# Patient Record
Sex: Male | Born: 1955 | Race: Black or African American | Hispanic: No | Smoking: Current every day smoker
Health system: Southern US, Community
[De-identification: ages and names within clinical notes are randomized; demographics above are authoritative.]

---

## 2020-08-10 ENCOUNTER — Encounter (HOSPITAL_COMMUNITY): Payer: Self-pay

## 2020-08-10 ENCOUNTER — Emergency Department (HOSPITAL_COMMUNITY)
Admission: EM | Admit: 2020-08-10 | Discharge: 2020-08-10 | Disposition: A | Discharge status: Home / Self Care | Payer: Self-pay | Attending: Emergency Medicine | Admitting: Emergency Medicine

## 2020-08-10 ENCOUNTER — Other Ambulatory Visit: Payer: Self-pay

## 2020-08-10 DIAGNOSIS — R600 Localized edema: Secondary | ICD-10-CM | POA: Insufficient documentation

## 2020-08-10 DIAGNOSIS — Z79899 Other long term (current) drug therapy: Secondary | ICD-10-CM | POA: Insufficient documentation

## 2020-08-10 DIAGNOSIS — F17210 Nicotine dependence, cigarettes, uncomplicated: Secondary | ICD-10-CM | POA: Insufficient documentation

## 2020-08-10 LAB — BRAIN NATRIURETIC PEPTIDE: B Natriuretic Peptide: 57.2 pg/mL (ref 0.0–100.0)

## 2020-08-10 LAB — COMPREHENSIVE METABOLIC PANEL
ALT: 19 U/L (ref 0–44)
AST: 19 U/L (ref 15–41)
Albumin: 3.6 g/dL (ref 3.5–5.0)
Alkaline Phosphatase: 69 U/L (ref 38–126)
Anion gap: 6 (ref 5–15)
BUN: 13 mg/dL (ref 8–23)
CO2: 28 mmol/L (ref 22–32)
Calcium: 9.3 mg/dL (ref 8.9–10.3)
Chloride: 104 mmol/L (ref 98–111)
Creatinine, Ser: 1.1 mg/dL (ref 0.61–1.24)
GFR, Estimated: >60 mL/min (ref >60)
Glucose, Bld: 101 mg/dL — ABNORMAL HIGH (ref 70–99)
Potassium: 3.5 mmol/L (ref 3.5–5.1)
Sodium: 138 mmol/L (ref 135–145)
Total Bilirubin: 0.4 mg/dL (ref 0.3–1.2)
Total Protein: 6.9 g/dL (ref 6.5–8.1)

## 2020-08-10 LAB — CBC WITH DIFFERENTIAL/PLATELET
Abs Immature Granulocytes: 0.03 10*3/uL (ref 0.00–0.07)
Basophils Absolute: 0 10*3/uL (ref 0.0–0.1)
Basophils Relative: 1 %
Eosinophils Absolute: 0.1 10*3/uL (ref 0.0–0.5)
Eosinophils Relative: 2 %
HCT: 36.7 % — ABNORMAL LOW (ref 39.0–52.0)
Hemoglobin: 12.3 g/dL — ABNORMAL LOW (ref 13.0–17.0)
Immature Granulocytes: 1 %
Lymphocytes Relative: 39 %
Lymphs Abs: 1.8 10*3/uL (ref 0.7–4.0)
MCH: 30.2 pg (ref 26.0–34.0)
MCHC: 33.5 g/dL (ref 30.0–36.0)
MCV: 90.2 fL (ref 80.0–100.0)
Monocytes Absolute: 0.5 10*3/uL (ref 0.1–1.0)
Monocytes Relative: 11 %
Neutro Abs: 2.2 10*3/uL (ref 1.7–7.7)
Neutrophils Relative %: 46 %
Platelets: 221 10*3/uL (ref 150–400)
RBC: 4.07 MIL/uL — ABNORMAL LOW (ref 4.22–5.81)
RDW: 13.9 % (ref 11.5–15.5)
WBC: 4.7 10*3/uL (ref 4.0–10.5)
nRBC: 0.4 % — ABNORMAL HIGH (ref 0.0–0.2)

## 2020-08-10 NOTE — ED Provider Notes (Signed)
MSE was initiated and I personally evaluated the patient and placed orders (if any) at  1:11 AM on August 10, 2020.  Patient to ED with increased LE edema and discomfort. History of same. No CP, SOB. On Lasix 20 mg daily but is becoming less effective. Out of Lasix now but took it daily until yesterday. No PCP currently.   3+ LE edema bilaterally   The patient appears stable so that the remainder of the MSE may be completed by another provider.   Elpidio Anis, PA-C 08/10/20 0114    Sabas Sous, MD 08/10/20 [phone]

## 2020-08-10 NOTE — Discharge Instructions (Addendum)
You were evaluated in the Emergency Department and after careful evaluation, we did not find any emergent condition requiring admission or further testing in the hospital.  Your exam/testing today was overall reassuring.  Swelling likely due to venous insufficiency.  Recommend compressions socks and close follow up with your PCP.  Please return to the Emergency Department if you experience any worsening of your condition.  Thank you for allowing [place] to be a part of your care.

## 2020-08-10 NOTE — ED Provider Notes (Signed)
[facility]-EMERGENCY DEPT [facility] Emergency Department Provider Note MRN:  [id number]  Arrival date & time: 08/10/20     Chief Complaint   Leg Swelling   History of Present Illness   Darin Roberts is a 65 y.o. year-old male with no pertinent PMH presenting to the ED with chief complaint of leg swelling.  Swelling of the legs for the past few months, worse over the past several days.  Has been taking 20 mg Lasix daily, does not seem to be helping.  Has been trying to elevate the legs and when he does it seems to improve.  Denies any chest pain or shortness of breath, no abdominal pain, no fever, no other complaints.  Review of Systems  A complete 10 system review of systems was obtained and all systems are negative except as noted in the HPI and PMH.   Patient's Health History   History reviewed. No pertinent past medical history.  History reviewed. No pertinent surgical history.  History reviewed. No pertinent family history.  Social History   Socioeconomic History   Marital status: Divorced    Spouse name: Not on file   Number of children: Not on file   Years of education: Not on file   Highest education level: Not on file  Occupational History   Not on file  Tobacco Use   Smoking status: Every Day    Types: Cigarettes   Smokeless tobacco: Never  Substance and Sexual Activity   Alcohol use: Yes   Drug use: Never   Sexual activity: Not on file  Other Topics Concern   Not on file  Social History Narrative   Not on file   Social Determinants of Health   Financial Resource Strain: Not on file  Food Insecurity: Not on file  Transportation Needs: Not on file  Physical Activity: Not on file  Stress: Not on file  Social Connections: Not on file  Intimate Partner Violence: Not on file     Physical Exam   Vitals:   08/10/20 0404 08/10/20 0423  BP: (!) 162/101 (!) 181/107  Pulse: 76 72  Resp: 18 18  Temp:  98.2 F (36.8 C)  SpO2: 95% 99%    CONSTITUTIONAL:  Well-appearing, NAD NEURO:  Alert and oriented x 3, no focal deficits EYES:  eyes equal and reactive ENT/NECK:  no LAD, no JVD CARDIO: Regular rate, well-perfused, normal S1 and S2 PULM:  CTAB no wheezing or rhonchi GI/GU:  normal bowel sounds, non-distended, non-tender MSK/SPINE:  No gross deformities SKIN: Chronic skin changes of the lower extremities with 1+ edema bilaterally PSYCH:  Appropriate speech and behavior  *Additional and/or pertinent findings included in MDM below  Diagnostic and Interventional Summary    EKG Interpretation  Date/Time:    Ventricular Rate:    PR Interval:    QRS Duration:   QT Interval:    QTC Calculation:   R Axis:     Text Interpretation:         Labs Reviewed  CBC WITH DIFFERENTIAL/PLATELET - Abnormal; Notable for the following components:      Result Value   RBC 4.07 (*)    Hemoglobin 12.3 (*)    HCT 36.7 (*)    nRBC 0.4 (*)    All other components within normal limits  COMPREHENSIVE METABOLIC PANEL - Abnormal; Notable for the following components:   Glucose, Bld 101 (*)    All other components within normal limits  BRAIN NATRIURETIC PEPTIDE    No orders  to display    Medications - No data to display   Procedures  /  Critical Care Procedures  ED Course and Medical Decision Making  I have reviewed the triage vital signs, the nursing notes, and pertinent available records from the EMR.  Listed above are laboratory and imaging tests that I personally ordered, reviewed, and interpreted and then considered in my medical decision making (see below for details).  Chronic leg swelling likely due to venous insufficiency, labs reassuring, no chest pain or shortness of breath, doubt cardiac or renal or hepatic etiology, appropriate for discharge with advice to use compression stockings and follow-up with PCP.       Elmer Sow. Pilar Plate, MD [facility] Emergency Medicine [facility] [email or web address]  Final Clinical  Impressions(s) / ED Diagnoses     ICD-10-CM   1. Leg edema  R60.0       ED Discharge Orders     None        Discharge Instructions Discussed with and Provided to Patient:    Discharge Instructions      You were evaluated in the Emergency Department and after careful evaluation, we did not find any emergent condition requiring admission or further testing in the hospital.  Your exam/testing today was overall reassuring.  Swelling likely due to venous insufficiency.  Recommend compressions socks and close follow up with your PCP.  Please return to the Emergency Department if you experience any worsening of your condition.  Thank you for allowing [place] to be a part of your care.        Sabas Sous, MD 08/10/20 [phone]

## 2020-08-10 NOTE — ED Triage Notes (Signed)
Bilateral leg swelling x 3 weeks and slight abdominal enlargement. Denies hx of CHF and CKD.

## 2020-08-11 ENCOUNTER — Other Ambulatory Visit: Payer: Self-pay

## 2020-08-11 ENCOUNTER — Emergency Department (HOSPITAL_COMMUNITY)
Admission: EM | Admit: 2020-08-11 | Discharge: 2020-08-11 | Disposition: A | Discharge status: Home / Self Care | Payer: Self-pay | Attending: Emergency Medicine | Admitting: Emergency Medicine

## 2020-08-11 DIAGNOSIS — Z5321 Procedure and treatment not carried out due to patient leaving prior to being seen by health care provider: Secondary | ICD-10-CM | POA: Insufficient documentation

## 2020-08-11 DIAGNOSIS — R600 Localized edema: Secondary | ICD-10-CM | POA: Insufficient documentation

## 2020-08-11 NOTE — ED Triage Notes (Signed)
Pt arrives to ER for c/o bilateral lower extremity leg edema from the knees down, pt c/o 5/10 pain.  Pt went to [facility] yesterday and d/c'd with lasix.  160/90, 70 HR, 98% ra.  Pt denies cp/shob.

## 2020-08-12 ENCOUNTER — Other Ambulatory Visit: Payer: Self-pay

## 2020-08-12 ENCOUNTER — Encounter (HOSPITAL_COMMUNITY): Payer: Self-pay

## 2020-08-12 ENCOUNTER — Emergency Department (HOSPITAL_COMMUNITY)
Admission: EM | Admit: 2020-08-12 | Discharge: 2020-08-12 | Disposition: A | Discharge status: Home / Self Care | Payer: Self-pay | Attending: Emergency Medicine | Admitting: Emergency Medicine

## 2020-08-12 DIAGNOSIS — R600 Localized edema: Secondary | ICD-10-CM | POA: Insufficient documentation

## 2020-08-12 DIAGNOSIS — F17210 Nicotine dependence, cigarettes, uncomplicated: Secondary | ICD-10-CM | POA: Insufficient documentation

## 2020-08-12 DIAGNOSIS — R202 Paresthesia of skin: Secondary | ICD-10-CM | POA: Insufficient documentation

## 2020-08-12 DIAGNOSIS — R609 Edema, unspecified: Secondary | ICD-10-CM

## 2020-08-12 DIAGNOSIS — Z91010 Allergy to peanuts: Secondary | ICD-10-CM | POA: Insufficient documentation

## 2020-08-12 DIAGNOSIS — I10 Essential (primary) hypertension: Secondary | ICD-10-CM | POA: Insufficient documentation

## 2020-08-12 NOTE — Discharge Instructions (Addendum)
Recommend using the compression hose as instructed to help reduce your leg swelling. Continue your blood pressure medications. It will be important for you to be seen and treated by your doctor as soon as you return home to [place].

## 2020-08-12 NOTE — ED Triage Notes (Signed)
Pt arrives with c/o bilateral lower leg swelling x 1 month. Sts HCTZ dose recently changed to 25mg .

## 2020-08-12 NOTE — ED Provider Notes (Addendum)
[facility]-EMERGENCY DEPT Provider Note   CSN: [id number] Arrival date & time: 08/12/20  0012     History Chief Complaint  Patient presents with   Leg Swelling    Darin Roberts is a 65 y.o. male.  Patient to ED for further evaluation of lower extremity swelling over the last one month. See for same on 7/20, discharged home with recommendation for PCP follow up. He returns now for persistent symptoms reporting that he was instructed to increase his HCTZ from 1/2 pill to a whole pill, which he did today for one dose. He reports subsequent tingling in his legs and a sense of dizziness. No pain. He cannot remember who instructed him to increase his HCTZ.   The history is provided by the patient. No language interpreter was used.      History reviewed. No pertinent past medical history.  There are no problems to display for this patient.   History reviewed. No pertinent surgical history.     No family history on file.  Social History   Tobacco Use   Smoking status: Every Day    Types: Cigarettes   Smokeless tobacco: Never  Substance Use Topics   Alcohol use: Yes   Drug use: Never    Home Medications Prior to Admission medications   Not on File    Allergies    Cimetidine, Peanut-containing drug products, Penicillins, Tetracyclines & related, Tomato, and Ranitidine  Review of Systems   Review of Systems  Constitutional:  Negative for chills and fever.  HENT: Negative.    Respiratory: Negative.    Cardiovascular:  Positive for leg swelling.  Gastrointestinal: Negative.   Musculoskeletal: Negative.   Skin: Negative.   Neurological:  Positive for dizziness.   Physical Exam Updated Vital Signs BP (!) 174/96 (BP Location: Left Arm)   Pulse 75   Temp 98.6 F (37 C) (Oral)   Resp 16   Ht 5' 10.5" (1.791 m)   Wt 117.9 kg   SpO2 95%   BMI 36.78 kg/m   Physical Exam Vitals and nursing note reviewed.  Constitutional:      Appearance: He  is well-developed.  Cardiovascular:     Rate and Rhythm: Normal rate.  Pulmonary:     Effort: Pulmonary effort is normal. No respiratory distress.  Musculoskeletal:        General: Normal range of motion.     Cervical back: Normal range of motion.     Right lower leg: Edema present.     Left lower leg: Edema present.  Skin:    General: Skin is warm and dry.  Neurological:     Mental Status: He is alert and oriented to person, place, and time.     Gait: Gait normal.    ED Results / Procedures / Treatments   Labs (all labs ordered are listed, but only abnormal results are displayed) Labs Reviewed - No data to display  EKG None  Radiology No results found.  Procedures Procedures   Medications Ordered in ED Medications - No data to display  ED Course  I have reviewed the triage vital signs and the nursing notes.  Pertinent labs & imaging results that were available during my care of the patient were reviewed by me and considered in my medical decision making (see chart for details).    MDM Rules/Calculators/A&P  Patient to ED for reevaluation of LE edema, and sxs of tingling and dizziness after increasing his HCTZ as he states he was instructed, although he cannot remember who instructed him to do this. He returned 7/21, and left without being seen.   The patient does not appear in any distress. He is ambulatory in the department without unsteadiness. VSS, hypertensive. No chest pain, SOB.   Initial interview/exam interrupted by the patient having to leave to go to the bathroom. He was then moved to a room where during exam process he became upset with his care, namely that his blood pressure was not being normalized, but again left the room to go to the bathroom prior to exam being completed. He had no evidence of difficulty or compromised breathing, no hypoxia, tachycardia or tachypnea. He walked on 2 occasions I observed and showed no signs of  dizziness, walking off balance or unsteadiness.   After review of the labs performed 7/20, the patient is felt appropriate for discharge home. He is encouraged to see his doctor for further management of hypertension. He is encouraged to use the compression stockings as recommended on previous visit - not wearing them at this time.   He reports he is unsatisfied with his care tonight and does not understand why his blood pressure is not being treated and that he is being discharged with it being elevated. Discussed at length that without evidence of heart strain (no chest pain, SOB) we do not treat blood pressure aggressively in the ED, especially at 174/96. Given his complaint about care, Dr. Nicanor Alcon saw the patient as well, without the patient's satisfaction, to which he voiced "I'll just check back in at 9:00 am.". He was told he could return for further care at any time.  Final Clinical Impression(s) / ED Diagnoses Final diagnoses:  None   Peripheral edema hypertension  Rx / DC Orders ED Discharge Orders     None        Elpidio Anis, PA-C 08/12/20 0345    Elpidio Anis, PA-C 08/12/20 0350    Palumbo, April, MD 08/12/20 8329

## 2020-08-24 ENCOUNTER — Other Ambulatory Visit: Payer: Self-pay

## 2020-08-24 ENCOUNTER — Emergency Department (HOSPITAL_COMMUNITY)
Admission: EM | Admit: 2020-08-24 | Discharge: 2020-08-24 | Disposition: A | Discharge status: Home / Self Care | Payer: Self-pay | Attending: Emergency Medicine | Admitting: Emergency Medicine

## 2020-08-24 ENCOUNTER — Emergency Department (HOSPITAL_COMMUNITY): Payer: Self-pay

## 2020-08-24 DIAGNOSIS — F17210 Nicotine dependence, cigarettes, uncomplicated: Secondary | ICD-10-CM | POA: Insufficient documentation

## 2020-08-24 DIAGNOSIS — R609 Edema, unspecified: Secondary | ICD-10-CM

## 2020-08-24 DIAGNOSIS — Z91010 Allergy to peanuts: Secondary | ICD-10-CM | POA: Insufficient documentation

## 2020-08-24 DIAGNOSIS — R600 Localized edema: Secondary | ICD-10-CM | POA: Insufficient documentation

## 2020-08-24 DIAGNOSIS — R0609 Other forms of dyspnea: Secondary | ICD-10-CM | POA: Insufficient documentation

## 2020-08-24 LAB — CBC WITH DIFFERENTIAL/PLATELET
Abs Immature Granulocytes: 0 10*3/uL (ref 0.00–0.07)
Basophils Absolute: 0 10*3/uL (ref 0.0–0.1)
Basophils Relative: 1 %
Eosinophils Absolute: 0.1 10*3/uL (ref 0.0–0.5)
Eosinophils Relative: 2 %
HCT: 37.2 % — ABNORMAL LOW (ref 39.0–52.0)
Hemoglobin: 12.3 g/dL — ABNORMAL LOW (ref 13.0–17.0)
Immature Granulocytes: 0 %
Lymphocytes Relative: 44 %
Lymphs Abs: 1.5 10*3/uL (ref 0.7–4.0)
MCH: 29.6 pg (ref 26.0–34.0)
MCHC: 33.1 g/dL (ref 30.0–36.0)
MCV: 89.6 fL (ref 80.0–100.0)
Monocytes Absolute: 0.4 10*3/uL (ref 0.1–1.0)
Monocytes Relative: 11 %
Neutro Abs: 1.5 10*3/uL — ABNORMAL LOW (ref 1.7–7.7)
Neutrophils Relative %: 42 %
Platelets: 218 10*3/uL (ref 150–400)
RBC: 4.15 MIL/uL — ABNORMAL LOW (ref 4.22–5.81)
RDW: 13.7 % (ref 11.5–15.5)
WBC: 3.5 10*3/uL — ABNORMAL LOW (ref 4.0–10.5)
nRBC: 0 % (ref 0.0–0.2)

## 2020-08-24 LAB — COMPREHENSIVE METABOLIC PANEL
ALT: 19 U/L (ref 0–44)
AST: 17 U/L (ref 15–41)
Albumin: 3.6 g/dL (ref 3.5–5.0)
Alkaline Phosphatase: 69 U/L (ref 38–126)
Anion gap: 7 (ref 5–15)
BUN: 15 mg/dL (ref 8–23)
CO2: 26 mmol/L (ref 22–32)
Calcium: 9 mg/dL (ref 8.9–10.3)
Chloride: 105 mmol/L (ref 98–111)
Creatinine, Ser: 1.13 mg/dL (ref 0.61–1.24)
GFR, Estimated: >60 mL/min (ref >60)
Glucose, Bld: 95 mg/dL (ref 70–99)
Potassium: 3.8 mmol/L (ref 3.5–5.1)
Sodium: 138 mmol/L (ref 135–145)
Total Bilirubin: 0.6 mg/dL (ref 0.3–1.2)
Total Protein: 6.7 g/dL (ref 6.5–8.1)

## 2020-08-24 LAB — URINALYSIS, ROUTINE W REFLEX MICROSCOPIC
Bilirubin Urine: NEGATIVE
Glucose, UA: NEGATIVE mg/dL
Hgb urine dipstick: NEGATIVE
Ketones, ur: NEGATIVE mg/dL
Leukocytes,Ua: NEGATIVE
Nitrite: NEGATIVE
Protein, ur: NEGATIVE mg/dL
Specific Gravity, Urine: 1.02 (ref 1.005–1.030)
pH: 5 (ref 5.0–8.0)

## 2020-08-24 LAB — TROPONIN I (HIGH SENSITIVITY): Troponin I (High Sensitivity): 13 ng/L (ref <18)

## 2020-08-24 LAB — BRAIN NATRIURETIC PEPTIDE: B Natriuretic Peptide: 61.5 pg/mL (ref 0.0–100.0)

## 2020-08-24 MED ORDER — FUROSEMIDE 20 MG PO TABS
40.0000 mg | ORAL_TABLET | Freq: Once | ORAL | Status: AC
  Administered 2020-08-24: 40 mg via ORAL
  Filled 2020-08-24: qty 2

## 2020-08-24 MED ORDER — FUROSEMIDE 40 MG PO TABS: 40.0000 mg | ORAL_TABLET | Freq: Every day | ORAL | 0 refills | Status: DC

## 2020-08-24 NOTE — ED Notes (Signed)
Pt ambulated with pulse ox, O2 remaining at 96-98%

## 2020-08-24 NOTE — ED Triage Notes (Signed)
Pt c/o bilateral leg swelling along with shortness of breath, worsening in exertion over past 3 weeks. Takes medication HCTZ without improvement. Today seeking medication attention d/t concern for tingling blather for past 1.5-2 weeks.pt states 4 lb increase in past 3 weeks, and has even increased shoe size d/t swelling.

## 2020-08-24 NOTE — Discharge Instructions (Addendum)
1. Medications: STOP HCTZ; START Lasix, usual home medications 2. Treatment: rest, monitor fluid and salt intake - decreasing both 3. Follow Up: Please followup with your primary doctor in 2-3 days for discussion of your diagnoses and further evaluation after today's visit; if you do not have a primary care doctor use the resource guide provided to find one; Please return to the ER for chest pain, shortness of breath or worsening symptoms

## 2020-08-24 NOTE — ED Provider Notes (Signed)
Emergency Medicine Provider Triage Evaluation Note  Darin Roberts , a 65 y.o. male  was evaluated in triage.  Pt complains of peripheral edema and SOB on exertion for the last 3.5 weeks.  Reports increased leg swelling and aching during that time.  Pt is not followed by a PCP or Cardiology.  Pt denies a hx of CHF.  Pt reports he was previously taking lasix but was switched to HCTZ at some point.  Pt reports compliance with this medication.   Pt home weight - 260 and reports 4 lb weight gain in the last few weeks and has needed to buy new shoes due to leg swelling  Review of Systems  Positive: Peripheral edema, shortness of breath Negative: Weakness, dysuria  Physical Exam  BP (!) 158/96 (BP Location: Right Arm)   Pulse 69   Temp 98.3 F (36.8 C)   Resp 20   SpO2 100%  Gen:   Awake, no distress   Resp:  Normal effort  MSK:   Moves extremities without difficulty, 3+ pitting edema Other:  anasarca  Medical Decision Making  Medically screening exam initiated at 1:47 AM.  Appropriate orders placed.  Darin Roberts was informed that the remainder of the evaluation will be completed by another provider, this initial triage assessment does not replace that evaluation, and the importance of remaining in the ED until their evaluation is complete.  Peripheral edema, SOB.    Darin Roberts, Darin Roberts 08/24/20 0151    Dione Booze, MD 08/24/20 Darin Roberts

## 2020-08-24 NOTE — ED Provider Notes (Signed)
[name] EMERGENCY DEPARTMENT Provider Note   CSN: [id number] Arrival date & time: 08/24/20  0133     History Chief Complaint  Patient presents with   Leg Swelling    Darin Roberts is a 65 y.o. male presents to the ED with complaints of peripheral edema and mild SOB on exertion for the last 3.5 weeks.  Reports increased leg swelling and aching during that time.  Pt is not followed by a PCP or Cardiology.  Pt denies a hx of CHF.  Pt reports he was previously taking lasix but was switched to HCTZ at some point.  Reports he is currently taking 25 mg of HCTZ every day.  Denies any missed doses.  Reports he currently weighs 260 pounds.  States 4 lb weight gain in the last few weeks and has needed to buy new shoes due to leg swelling.  Patient denies chest pain, shortness of breath at rest.  Denies fevers, chills, open wounds, weeping of his legs.  Denies abdominal pain, nausea or vomiting.  Reviewed.  Patient has been seen several times in the last several weeks for this.  Has no history of heart failure.  Has not established with PCP.    The history is provided by the patient and medical records. No language interpreter was used.      No past medical history on file.  There are no problems to display for this patient.   No past surgical history on file.     No family history on file.  Social History   Tobacco Use   Smoking status: Every Day    Types: Cigarettes   Smokeless tobacco: Never  Substance Use Topics   Alcohol use: Yes   Drug use: Never    Home Medications Prior to Admission medications   Medication Sig Start Date End Date Taking? Authorizing Provider  furosemide (LASIX) 40 MG tablet Take 1 tablet (40 mg total) by mouth daily. 08/24/20  Yes Herbie Lehrmann, Dahlia Client, PA-C    Allergies    Cimetidine, Peanut-containing drug products, Penicillins, Tetracyclines & related, Tomato, and Ranitidine  Review of Systems   Review of Systems  Constitutional:   Negative for appetite change, diaphoresis, fatigue, fever and unexpected weight change.  HENT:  Negative for mouth sores.   Eyes:  Negative for visual disturbance.  Respiratory:  Positive for shortness of breath. Negative for cough, chest tightness and wheezing.   Cardiovascular:  Positive for leg swelling. Negative for chest pain.  Gastrointestinal:  Negative for abdominal pain, constipation, diarrhea, nausea and vomiting.  Endocrine: Negative for polydipsia, polyphagia and polyuria.  Genitourinary:  Negative for dysuria, frequency, hematuria and urgency.  Musculoskeletal:  Negative for back pain and neck stiffness.  Skin:  Negative for rash.  Allergic/Immunologic: Negative for immunocompromised state.  Neurological:  Negative for syncope, light-headedness and headaches.  Hematological:  Does not bruise/bleed easily.  Psychiatric/Behavioral:  Negative for sleep disturbance. The patient is not nervous/anxious.    Physical Exam Updated Vital Signs BP (!) 158/96 (BP Location: Right Arm)   Pulse 69   Temp 98.3 F (36.8 C)   Resp 20   SpO2 100%   Physical Exam Vitals and nursing note reviewed.  Constitutional:      General: He is not in acute distress.    Appearance: He is not diaphoretic.  HENT:     Head: Normocephalic.  Eyes:     General: No scleral icterus.    Conjunctiva/sclera: Conjunctivae normal.  Cardiovascular:  Rate and Rhythm: Normal rate and regular rhythm.     Pulses: Normal pulses.          Radial pulses are 2+ on the right side and 2+ on the left side.  Pulmonary:     Effort: No tachypnea, accessory muscle usage, prolonged expiration, respiratory distress or retractions.     Breath sounds: No stridor.     Comments: Equal chest rise. No increased work of breathing. Abdominal:     General: There is no distension.     Palpations: Abdomen is soft.     Tenderness: There is no abdominal tenderness. There is no guarding or rebound.  Musculoskeletal:      Cervical back: Normal range of motion.     Right lower leg: Edema present.     Left lower leg: Edema present.     Comments: Moves all extremities equally and without difficulty. Profound bilateral lower extremity edema  Skin:    General: Skin is warm and dry.     Capillary Refill: Capillary refill takes less than 2 seconds.  Neurological:     Mental Status: He is alert.     GCS: GCS eye subscore is 4. GCS verbal subscore is 5. GCS motor subscore is 6.     Comments: Speech is clear and goal oriented.  Psychiatric:        Mood and Affect: Mood normal.    ED Results / Procedures / Treatments   Labs (all labs ordered are listed, but only abnormal results are displayed) Labs Reviewed  CBC WITH DIFFERENTIAL/PLATELET - Abnormal; Notable for the following components:      Result Value   WBC 3.5 (*)    RBC 4.15 (*)    Hemoglobin 12.3 (*)    HCT 37.2 (*)    Neutro Abs 1.5 (*)    All other components within normal limits  URINALYSIS, ROUTINE W REFLEX MICROSCOPIC - Abnormal; Notable for the following components:   APPearance HAZY (*)    All other components within normal limits  COMPREHENSIVE METABOLIC PANEL  BRAIN NATRIURETIC PEPTIDE  TROPONIN I (HIGH SENSITIVITY)  TROPONIN I (HIGH SENSITIVITY)    EKG EKG Interpretation  Date/Time:  Wednesday August 24 2020 01:54:36 EDT Ventricular Rate:  71 PR Interval:  154 QRS Duration: 74 QT Interval:  416 QTC Calculation: 452 R Axis:   69 Text Interpretation: Normal sinus rhythm Normal ECG No old tracing to compare Confirmed by Dione Booze ([id number]) on 08/24/2020 4:38:45 AM  Radiology DG Chest 2 View  Result Date: 08/24/2020 CLINICAL DATA:  Shortness of breath EXAM: CHEST - 2 VIEW COMPARISON:  None. FINDINGS: The heart size and mediastinal contours are within normal limits. Both lungs are clear. The visualized skeletal structures are unremarkable. IMPRESSION: No active cardiopulmonary disease. Electronically Signed   By: Deatra Robinson M.D.    On: 08/24/2020 02:30    Procedures Procedures   Medications Ordered in ED Medications  furosemide (LASIX) tablet 40 mg (40 mg Oral Given 08/24/20 0436)    ED Course  I have reviewed the triage vital signs and the nursing notes.  Pertinent labs & imaging results that were available during my care of the patient were reviewed by me and considered in my medical decision making (see chart for details).    MDM Rules/Calculators/A&P                           Patient presents with significant peripheral edema and hypertension.  He has been evaluated for this a number of times in the last several weeks.  Work-up each time has been reassuring.  Patient is currently taking HCTZ 25 mg/day. Hypertension today but no evidence of hypertensive urgency.  Blood pressure appears to be baseline for patient.  EKG without ischemia.  Urinalysis without proteinuria.  Chest x-ray without evidence of pulmonary edema or vascular congestion.  Mild anemia appears to be baseline.  On triage exam concern for anasarca however more comprehensive physical exam shows abdomen is soft and nontender.  No palpable or visible edema of the abdomen.  Patient ambulates here in the emergency department without respiratory distress, increased work of breathing or hypoxia.  No evidence of congestive heart failure today.  Patient reports he felt the Lasix was working better than the HCTZ.  We will switch him back.  Strongly encourage patient to establish with a primary care and discussed that management of peripheral edema requires regular check-in's and medication adjustments which are difficult to do in the emergency department setting.  Patient states understanding and is in agreement with the plan.    Final Clinical Impression(s) / ED Diagnoses Final diagnoses:  Peripheral edema    Rx / DC Orders ED Discharge Orders          Ordered    furosemide (LASIX) 40 MG tablet  Daily        08/24/20 0436              Marquavius Scaife, Dahlia Client, PA-C 08/24/20 0458    Dione Booze, MD 08/24/20 [phone]

## 2020-08-27 ENCOUNTER — Encounter (HOSPITAL_COMMUNITY): Payer: Self-pay | Admitting: Emergency Medicine

## 2020-08-27 ENCOUNTER — Emergency Department (HOSPITAL_COMMUNITY)
Admission: EM | Admit: 2020-08-27 | Discharge: 2020-08-28 | Disposition: A | Discharge status: Home / Self Care | Payer: Self-pay | Attending: Emergency Medicine | Admitting: Emergency Medicine

## 2020-08-27 ENCOUNTER — Other Ambulatory Visit: Payer: Self-pay

## 2020-08-27 DIAGNOSIS — Z5321 Procedure and treatment not carried out due to patient leaving prior to being seen by health care provider: Secondary | ICD-10-CM | POA: Insufficient documentation

## 2020-08-27 DIAGNOSIS — R600 Localized edema: Secondary | ICD-10-CM | POA: Insufficient documentation

## 2020-08-27 DIAGNOSIS — M79604 Pain in right leg: Secondary | ICD-10-CM | POA: Insufficient documentation

## 2020-08-27 NOTE — ED Triage Notes (Signed)
Per EMS, pt c/o left leg pain and BLLE edema. Recently rx'd lasix. Pt ambulatory in triage without difficulty. VSS.

## 2020-08-28 NOTE — ED Provider Notes (Addendum)
Patient left without being seen; was not brought back to room.  Note: Portions of this report may have been transcribed using voice recognition software. Every effort was made to ensure accuracy; however, inadvertent computerized transcription errors may still be present.    Bill Salinas, PA-C 08/28/20 0700    Bill Salinas, PA-C 09/16/20 5797    Pricilla Loveless, MD 09/16/20 [phone]

## 2020-08-29 ENCOUNTER — Encounter (HOSPITAL_COMMUNITY): Payer: Self-pay | Admitting: Emergency Medicine

## 2020-08-29 ENCOUNTER — Emergency Department (HOSPITAL_COMMUNITY)
Admission: EM | Admit: 2020-08-29 | Discharge: 2020-08-29 | Disposition: A | Discharge status: Home / Self Care | Payer: Self-pay | Attending: Emergency Medicine | Admitting: Emergency Medicine

## 2020-08-29 ENCOUNTER — Other Ambulatory Visit: Payer: Self-pay

## 2020-08-29 DIAGNOSIS — F17210 Nicotine dependence, cigarettes, uncomplicated: Secondary | ICD-10-CM | POA: Insufficient documentation

## 2020-08-29 DIAGNOSIS — M7989 Other specified soft tissue disorders: Secondary | ICD-10-CM | POA: Insufficient documentation

## 2020-08-29 DIAGNOSIS — R202 Paresthesia of skin: Secondary | ICD-10-CM | POA: Insufficient documentation

## 2020-08-29 DIAGNOSIS — R519 Headache, unspecified: Secondary | ICD-10-CM | POA: Insufficient documentation

## 2020-08-29 DIAGNOSIS — R059 Cough, unspecified: Secondary | ICD-10-CM | POA: Insufficient documentation

## 2020-08-29 DIAGNOSIS — I872 Venous insufficiency (chronic) (peripheral): Secondary | ICD-10-CM | POA: Insufficient documentation

## 2020-08-29 DIAGNOSIS — Z5321 Procedure and treatment not carried out due to patient leaving prior to being seen by health care provider: Secondary | ICD-10-CM | POA: Insufficient documentation

## 2020-08-29 NOTE — ED Triage Notes (Addendum)
Patient reports runny nose/nasal congestion and occasional dry cough with headache this week . Respirations unlabored. No fever or chills . Hypertensive at triage .

## 2020-08-30 ENCOUNTER — Emergency Department (HOSPITAL_COMMUNITY)
Admission: EM | Admit: 2020-08-30 | Discharge: 2020-08-30 | Disposition: A | Discharge status: Home / Self Care | Payer: Self-pay | Attending: Emergency Medicine | Admitting: Emergency Medicine

## 2020-08-30 ENCOUNTER — Encounter (HOSPITAL_COMMUNITY): Payer: Self-pay

## 2020-08-30 DIAGNOSIS — M7989 Other specified soft tissue disorders: Secondary | ICD-10-CM

## 2020-08-30 DIAGNOSIS — I872 Venous insufficiency (chronic) (peripheral): Secondary | ICD-10-CM

## 2020-08-30 NOTE — ED Notes (Signed)
Pt ambulatory in ED lobby. 

## 2020-08-30 NOTE — Discharge Instructions (Addendum)
You were evaluated in the Emergency Department and after careful evaluation, we did not find any emergent condition requiring admission or further testing in the hospital.  Your exam/testing today was overall reassuring.  Symptoms seem to be due to venous insufficiency.  Recommend compression socks or stockings and elevating the legs as we discussed.  Continue the HCTZ, recommend follow-up with your primary care doctor.  Please return to the Emergency Department if you experience any worsening of your condition.  Thank you for allowing [place] to be a part of your care.

## 2020-08-30 NOTE — ED Triage Notes (Signed)
Pt complains of left leg swelling and tingling x 4 weeks. Pt also complains of a headache x 1 week.

## 2020-08-30 NOTE — ED Provider Notes (Signed)
[facility]-EMERGENCY DEPT [facility] Emergency Department Provider Note MRN:  [id number]  Arrival date & time: 08/30/20     Chief Complaint   Leg Swelling, Headache, and Tingling   History of Present Illness   Darin Roberts is a 65 y.o. year-old male with a history of leg swelling presenting to the ED with chief complaint of leg swelling.  Leg swelling for the past several weeks, not getting better.  Has been taking HCTZ.  Denies any trauma, no chest pain, no shortness of breath, no fever, no cough, no abdominal pain.  Symptoms are constant, mild to moderate, no exacerbating or alleviating factors.  Review of Systems  A complete 10 system review of systems was obtained and all systems are negative except as noted in the HPI and PMH.   Patient's Health History   History reviewed. No pertinent past medical history.  History reviewed. No pertinent surgical history.  No family history on file.  Social History   Socioeconomic History   Marital status: Divorced    Spouse name: Not on file   Number of children: Not on file   Years of education: Not on file   Highest education level: Not on file  Occupational History   Not on file  Tobacco Use   Smoking status: Every Day    Types: Cigarettes   Smokeless tobacco: Never  Substance and Sexual Activity   Alcohol use: Yes   Drug use: Never   Sexual activity: Not on file  Other Topics Concern   Not on file  Social History Narrative   Not on file   Social Determinants of Health   Financial Resource Strain: Not on file  Food Insecurity: Not on file  Transportation Needs: Not on file  Physical Activity: Not on file  Stress: Not on file  Social Connections: Not on file  Intimate Partner Violence: Not on file     Physical Exam   Vitals:   08/30/20 0044 08/30/20 0410  BP: (!) 185/109 (!) 151/96  Pulse: 66 79  Resp: 16 16  Temp: 98 F (36.7 C)   SpO2: 95% 96%    CONSTITUTIONAL: Well-appearing, NAD NEURO:  Alert and  oriented x 3, no focal deficits EYES:  eyes equal and reactive ENT/NECK:  no LAD, no JVD CARDIO: Regular rate, well-perfused, normal S1 and S2 PULM:  CTAB no wheezing or rhonchi GI/GU:  normal bowel sounds, non-distended, non-tender MSK/SPINE:  No gross deformities, bilateral lower extremity edema and chronic skin changes SKIN:  no rash, atraumatic PSYCH:  Appropriate speech and behavior  *Additional and/or pertinent findings included in MDM below  Diagnostic and Interventional Summary    EKG Interpretation  Date/Time:    Ventricular Rate:    PR Interval:    QRS Duration:   QT Interval:    QTC Calculation:   R Axis:     Text Interpretation:         Labs Reviewed - No data to display  No orders to display    Medications - No data to display   Procedures  /  Critical Care Procedures  ED Course and Medical Decision Making  I have reviewed the triage vital signs, the nursing notes, and pertinent available records from the EMR.  Listed above are laboratory and imaging tests that I personally ordered, reviewed, and interpreted and then considered in my medical decision making (see below for details).  Repeated ED visits for the same problem, problem does not seem to be worsening, just not  getting better.  Patient was further educated on the cause of his leg swelling.  He has had multiple work-ups that do not show any evidence of CHF or liver impairment or hepatic impairment.  His swelling is almost certainly due to venous insufficiency, advised leg stockings or compressive socks which she is not using, advised elevating the legs, which she is not doing.  Appropriate for discharge.       Elmer Sow. Pilar Plate, MD [facility] Emergency Medicine [facility] [email or web address]  Final Clinical Impressions(s) / ED Diagnoses     ICD-10-CM   1. Leg swelling  M79.89     2. Venous insufficiency (chronic) (peripheral)  I87.2       ED Discharge Orders     None         Discharge Instructions Discussed with and Provided to Patient:    Discharge Instructions      You were evaluated in the Emergency Department and after careful evaluation, we did not find any emergent condition requiring admission or further testing in the hospital.  Your exam/testing today was overall reassuring.  Symptoms seem to be due to venous insufficiency.  Recommend compression socks or stockings and elevating the legs as we discussed.  Continue the HCTZ, recommend follow-up with your primary care doctor.  Please return to the Emergency Department if you experience any worsening of your condition.  Thank you for allowing [place] to be a part of your care.        Sabas Sous, MD 08/30/20 [phone]

## 2020-08-31 ENCOUNTER — Other Ambulatory Visit: Payer: Self-pay

## 2020-08-31 ENCOUNTER — Encounter (HOSPITAL_COMMUNITY): Payer: Self-pay

## 2020-08-31 ENCOUNTER — Emergency Department (HOSPITAL_COMMUNITY)
Admission: EM | Admit: 2020-08-31 | Discharge: 2020-08-31 | Disposition: A | Discharge status: Home / Self Care | Payer: PRIVATE HEALTH INSURANCE | Attending: Emergency Medicine | Admitting: Emergency Medicine

## 2020-08-31 DIAGNOSIS — I878 Other specified disorders of veins: Secondary | ICD-10-CM

## 2020-08-31 DIAGNOSIS — I872 Venous insufficiency (chronic) (peripheral): Secondary | ICD-10-CM | POA: Insufficient documentation

## 2020-08-31 DIAGNOSIS — F17210 Nicotine dependence, cigarettes, uncomplicated: Secondary | ICD-10-CM | POA: Insufficient documentation

## 2020-08-31 DIAGNOSIS — I10 Essential (primary) hypertension: Secondary | ICD-10-CM | POA: Insufficient documentation

## 2020-08-31 DIAGNOSIS — Z91010 Allergy to peanuts: Secondary | ICD-10-CM | POA: Insufficient documentation

## 2020-08-31 MED ORDER — AMLODIPINE BESYLATE 5 MG PO TABS
5.0000 mg | ORAL_TABLET | Freq: Once | ORAL | Status: AC
  Administered 2020-08-31: 5 mg via ORAL
  Filled 2020-08-31: qty 1

## 2020-08-31 MED ORDER — AMLODIPINE BESYLATE 5 MG PO TABS: 5.0000 mg | ORAL_TABLET | Freq: Every day | ORAL | 0 refills | Status: AC

## 2020-08-31 MED ORDER — FUROSEMIDE 20 MG PO TABS: 40.0000 mg | ORAL_TABLET | Freq: Every day | ORAL | 0 refills | Status: AC

## 2020-08-31 NOTE — Discharge Instructions (Addendum)
Stop taking hydrochlorothiazide.  Please check your blood pressure once a day and keep a record of it.  Take that record with you when you see your primary care provider.  Your blood pressure should be managed by your primary care provider.  While you are welcome to come to the emergency department at any time to evaluate your concerns, it is not the proper location to adjust your blood pressure medications.

## 2020-08-31 NOTE — ED Notes (Signed)
Pt reports his BP goes up after taking his HCTZ. Does not want blood work done at this time.

## 2020-08-31 NOTE — ED Triage Notes (Signed)
Pt states that he may need to have a medication (HCTZ) change due to it not working effectively. Pt complains of wheezing and leg swelling.

## 2020-08-31 NOTE — ED Notes (Signed)
Pt ambulatory in ED lobby. 

## 2020-08-31 NOTE — ED Provider Notes (Signed)
[facility]-EMERGENCY DEPT Provider Note   CSN: [id number] Arrival date & time: 08/31/20  0110     History Chief Complaint  Patient presents with   Hypertension   Leg Swelling   Wheezing    Darin Roberts is a 65 y.o. male.  The history is provided by the patient.  Hypertension  Wheezing He has history of hypertension and chronic venous stasis and comes in because of concerns of elevated blood pressure and also intolerance to taking hydrochlorothiazide.  He states that every time he takes it he gets some headache and dizziness and sometimes some paresthesias.  These are relatively short-lived, but have been present each time that he takes it.  There was a period where he was switched to furosemide and he did not have similar symptoms.  He denies chest pain or dyspnea.   History reviewed. No pertinent past medical history.  There are no problems to display for this patient.   History reviewed. No pertinent surgical history.     History reviewed. No pertinent family history.  Social History   Tobacco Use   Smoking status: Every Day    Types: Cigarettes   Smokeless tobacco: Never  Substance Use Topics   Alcohol use: Yes   Drug use: Never    Home Medications Prior to Admission medications   Medication Sig Start Date End Date Taking? Authorizing Provider  furosemide (LASIX) 40 MG tablet Take 1 tablet (40 mg total) by mouth daily. 08/24/20   Muthersbaugh, Dahlia Client, PA-C    Allergies    Cimetidine, Peanut-containing drug products, Penicillins, Tetracyclines & related, Tomato, and Ranitidine  Review of Systems   Review of Systems  Respiratory:  Positive for wheezing.   All other systems reviewed and are negative.  Physical Exam Updated Vital Signs BP (!) 166/133   Pulse 90   Temp 97.8 F (36.6 C) (Oral)   Resp 18   SpO2 97%   Physical Exam Vitals and nursing note reviewed.  65 year old male, resting comfortably and in no acute distress.  Vital signs are significant for elevated blood pressure. Oxygen saturation is 97%, which is normal. Head is normocephalic and atraumatic. PERRLA, EOMI. Oropharynx is clear. Neck is nontender and supple without adenopathy or JVD. Back is nontender and there is no CVA tenderness. Lungs are clear without rales, wheezes, or rhonchi. Chest is nontender. Heart has regular rate and rhythm without murmur. Abdomen is soft, flat, nontender without masses or hepatosplenomegaly and peristalsis is normoactive. Extremities have 2+ nonpitting edema, full range of motion is present. Skin is warm and dry without rash. Neurologic: Mental status is normal, cranial nerves are intact, there are no motor or sensory deficits.  ED Results / Procedures / Treatments    Procedures Procedures   Medications Ordered in ED Medications  amLODipine (NORVASC) tablet 5 mg (has no administration in time range)    ED Course  I have reviewed the triage vital signs and the nursing notes.  MDM Rules/Calculators/A&P                         Elevated blood pressure.  Chronic leg swelling.  Apparent intolerance to hydrochlorothiazide.  Old records reviewed, and this is his eighth ED visit over the past 3 weeks for the same complaints.  Blood pressure has been elevated on each occasion.  Diuretics have been changed without any significant change in his clinical picture.  At this point, he does appear to be  intolerant of hydrochlorothiazide and I have asked him to stop taking it, will replace with a low-dose of furosemide.  We will add amlodipine for blood pressure control.  He has an appointment with his PCP in 5 days and he is to keep that appointment.  It was stressed to the patient that blood pressure management should be done through his PCP and not through the emergency department, but he was reassured that he is welcome to come to the emergency department at any time for concerns of any acute process.  Patient expressed  understanding.  Final Clinical Impression(s) / ED Diagnoses Final diagnoses:  Poorly-controlled hypertension  Chronic venous stasis    Rx / DC Orders ED Discharge Orders          Ordered    furosemide (LASIX) 20 MG tablet  Daily        08/31/20 0350    amLODipine (NORVASC) 5 MG tablet  Daily        08/31/20 0350             Dione Booze, MD 08/31/20 [phone]

## 2020-09-03 ENCOUNTER — Other Ambulatory Visit: Payer: Self-pay

## 2020-09-03 ENCOUNTER — Emergency Department (HOSPITAL_COMMUNITY)
Admission: EM | Admit: 2020-09-03 | Discharge: 2020-09-03 | Disposition: A | Discharge status: Home / Self Care | Payer: Self-pay | Attending: Emergency Medicine | Admitting: Emergency Medicine

## 2020-09-03 ENCOUNTER — Emergency Department (HOSPITAL_COMMUNITY): Payer: Self-pay

## 2020-09-03 DIAGNOSIS — Z91010 Allergy to peanuts: Secondary | ICD-10-CM | POA: Insufficient documentation

## 2020-09-03 DIAGNOSIS — F17210 Nicotine dependence, cigarettes, uncomplicated: Secondary | ICD-10-CM | POA: Insufficient documentation

## 2020-09-03 DIAGNOSIS — R600 Localized edema: Secondary | ICD-10-CM | POA: Insufficient documentation

## 2020-09-03 DIAGNOSIS — R0602 Shortness of breath: Secondary | ICD-10-CM | POA: Insufficient documentation

## 2020-09-03 LAB — BASIC METABOLIC PANEL
Anion gap: 5 (ref 5–15)
BUN: 17 mg/dL (ref 8–23)
CO2: 29 mmol/L (ref 22–32)
Calcium: 9.2 mg/dL (ref 8.9–10.3)
Chloride: 102 mmol/L (ref 98–111)
Creatinine, Ser: 1.4 mg/dL — ABNORMAL HIGH (ref 0.61–1.24)
GFR, Estimated: 57 mL/min — ABNORMAL LOW (ref >60)
Glucose, Bld: 109 mg/dL — ABNORMAL HIGH (ref 70–99)
Potassium: 4.3 mmol/L (ref 3.5–5.1)
Sodium: 136 mmol/L (ref 135–145)

## 2020-09-03 LAB — CBC WITH DIFFERENTIAL/PLATELET
Abs Immature Granulocytes: 0 10*3/uL (ref 0.00–0.07)
Basophils Absolute: 0 10*3/uL (ref 0.0–0.1)
Basophils Relative: 1 %
Eosinophils Absolute: 0.1 10*3/uL (ref 0.0–0.5)
Eosinophils Relative: 2 %
HCT: 36.7 % — ABNORMAL LOW (ref 39.0–52.0)
Hemoglobin: 12.5 g/dL — ABNORMAL LOW (ref 13.0–17.0)
Immature Granulocytes: 0 %
Lymphocytes Relative: 41 %
Lymphs Abs: 1.9 10*3/uL (ref 0.7–4.0)
MCH: 30.6 pg (ref 26.0–34.0)
MCHC: 34.1 g/dL (ref 30.0–36.0)
MCV: 89.7 fL (ref 80.0–100.0)
Monocytes Absolute: 0.6 10*3/uL (ref 0.1–1.0)
Monocytes Relative: 12 %
Neutro Abs: 2.1 10*3/uL (ref 1.7–7.7)
Neutrophils Relative %: 44 %
Platelets: 203 10*3/uL (ref 150–400)
RBC: 4.09 MIL/uL — ABNORMAL LOW (ref 4.22–5.81)
RDW: 13.7 % (ref 11.5–15.5)
WBC: 4.7 10*3/uL (ref 4.0–10.5)
nRBC: 0 % (ref 0.0–0.2)

## 2020-09-03 LAB — BRAIN NATRIURETIC PEPTIDE: B Natriuretic Peptide: 27.5 pg/mL (ref 0.0–100.0)

## 2020-09-03 NOTE — ED Provider Notes (Signed)
[facility] EMERGENCY DEPARTMENT Provider Note   CSN: [id number] Arrival date & time: 09/03/20  0123     History Chief Complaint  Patient presents with   Shortness of Breath    Darin Roberts is a 65 y.o. male.   Shortness of Breath Severity:  Mild Onset quality:  Gradual Duration:  2 weeks Timing:  Intermittent Chronicity:  Chronic Context: not activity       No past medical history on file.  There are no problems to display for this patient.   No past surgical history on file.     No family history on file.  Social History   Tobacco Use   Smoking status: Every Day    Types: Cigarettes   Smokeless tobacco: Never  Substance Use Topics   Alcohol use: Yes   Drug use: Never    Home Medications Prior to Admission medications   Medication Sig Start Date End Date Taking? Authorizing Provider  amLODipine (NORVASC) 5 MG tablet Take 1 tablet (5 mg total) by mouth daily. 08/31/20   Dione Booze, MD  furosemide (LASIX) 20 MG tablet Take 2 tablets (40 mg total) by mouth daily. 08/31/20   Dione Booze, MD    Allergies    Cimetidine, Peanut-containing drug products, Penicillins, Tetracyclines & related, Tomato, and Ranitidine  Review of Systems   Review of Systems  Respiratory:  Positive for shortness of breath.   All other systems reviewed and are negative.  Physical Exam Updated Vital Signs BP (!) 148/90   Pulse 86   Temp 97.9 F (36.6 C) (Oral)   Resp 16   SpO2 (!) 89%   Physical Exam Vitals and nursing note reviewed.  Constitutional:      Appearance: He is well-developed.  HENT:     Head: Normocephalic and atraumatic.  Cardiovascular:     Rate and Rhythm: Normal rate.  Pulmonary:     Effort: Pulmonary effort is normal. No respiratory distress.     Breath sounds: No decreased breath sounds or wheezing.  Abdominal:     General: There is no distension.  Musculoskeletal:        General: Normal range of motion.     Cervical back:  Normal range of motion.     Right lower leg: Edema present.     Left lower leg: Edema present.  Neurological:     General: No focal deficit present.     Mental Status: He is alert.    ED Results / Procedures / Treatments   Labs (all labs ordered are listed, but only abnormal results are displayed) Labs Reviewed  CBC WITH DIFFERENTIAL/PLATELET - Abnormal; Notable for the following components:      Result Value   RBC 4.09 (*)    Hemoglobin 12.5 (*)    HCT 36.7 (*)    All other components within normal limits  BASIC METABOLIC PANEL - Abnormal; Notable for the following components:   Glucose, Bld 109 (*)    Creatinine, Ser 1.40 (*)    GFR, Estimated 57 (*)    All other components within normal limits  BRAIN NATRIURETIC PEPTIDE    EKG EKG Interpretation  Date/Time:  Saturday September 03 2020 01:49:34 EDT Ventricular Rate:  70 PR Interval:  162 QRS Duration: 76 QT Interval:  408 QTC Calculation: 440 R Axis:   54 Text Interpretation: Normal sinus rhythm Normal ECG Confirmed by Marily Memos [phone]) on 09/03/2020 2:42:29 AM  Radiology DG Chest 2 View  Result Date: 09/03/2020  CLINICAL DATA:  Shortness of breath EXAM: CHEST - 2 VIEW COMPARISON:  08/24/2020 FINDINGS: Lungs are clear.  No pleural effusion or pneumothorax. The heart is normal in size. Visualized osseous structures are within normal limits. IMPRESSION: Normal chest radiographs. Electronically Signed   By: Charline Bills M.D.   On: 09/03/2020 02:32    Procedures Procedures   Medications Ordered in ED Medications - No data to display  ED Course  I have reviewed the triage vital signs and the nursing notes.  Pertinent labs & imaging results that were available during my care of the patient were reviewed by me and considered in my medical decision making (see chart for details).    MDM Rules/Calculators/A&P                           No obvious changes from may recent visits. Will continue with plan to fu w/  pcp on Monday.   Final Clinical Impression(s) / ED Diagnoses Final diagnoses:  None    Rx / DC Orders ED Discharge Orders     None        Quinnie Barcelo, Barbara Cower, MD 09/03/20 [phone]

## 2020-09-03 NOTE — ED Provider Notes (Signed)
MSE was initiated and I personally evaluated the patient and placed orders (if any) at  1:34 AM on September 03, 2020.  Darin Roberts returns to the ED after waking up tonight feeling that he was choking, couldn't catch his breath. No chest pain. No fever. He reports h/o sleep apnea but not on CPAP. Legs continue to be swollen.   Bilateral LE edema Awake, alert, in NAD  The patient appears stable so that the remainder of the MSE may be completed by another provider.   Elpidio Anis, PA-C 09/03/20 9675    Darin Memos, MD 09/03/20 [phone]

## 2020-09-03 NOTE — ED Triage Notes (Signed)
Pt reported to ED with c/o [facility], sleep apnea and dizziness. Denies usage of CPAP. Reports occasional cough.

## 2020-09-06 ENCOUNTER — Emergency Department (HOSPITAL_COMMUNITY)
Admission: EM | Admit: 2020-09-06 | Discharge: 2020-09-06 | Disposition: A | Discharge status: Home / Self Care | Payer: Self-pay | Attending: Emergency Medicine | Admitting: Emergency Medicine

## 2020-09-06 ENCOUNTER — Other Ambulatory Visit: Payer: Self-pay

## 2020-09-06 ENCOUNTER — Emergency Department (HOSPITAL_COMMUNITY): Payer: Self-pay

## 2020-09-06 ENCOUNTER — Encounter (HOSPITAL_COMMUNITY): Payer: Self-pay

## 2020-09-06 DIAGNOSIS — I10 Essential (primary) hypertension: Secondary | ICD-10-CM | POA: Insufficient documentation

## 2020-09-06 DIAGNOSIS — R0602 Shortness of breath: Secondary | ICD-10-CM | POA: Insufficient documentation

## 2020-09-06 DIAGNOSIS — R079 Chest pain, unspecified: Secondary | ICD-10-CM | POA: Insufficient documentation

## 2020-09-06 DIAGNOSIS — Z79899 Other long term (current) drug therapy: Secondary | ICD-10-CM | POA: Insufficient documentation

## 2020-09-06 LAB — CBC WITH DIFFERENTIAL/PLATELET
Abs Immature Granulocytes: 0.01 10*3/uL (ref 0.00–0.07)
Basophils Absolute: 0 10*3/uL (ref 0.0–0.1)
Basophils Relative: 1 %
Eosinophils Absolute: 0.2 10*3/uL (ref 0.0–0.5)
Eosinophils Relative: 3 %
HCT: 36.6 % — ABNORMAL LOW (ref 39.0–52.0)
Hemoglobin: 12.1 g/dL — ABNORMAL LOW (ref 13.0–17.0)
Immature Granulocytes: 0 %
Lymphocytes Relative: 50 %
Lymphs Abs: 2.3 10*3/uL (ref 0.7–4.0)
MCH: 29.7 pg (ref 26.0–34.0)
MCHC: 33.1 g/dL (ref 30.0–36.0)
MCV: 89.9 fL (ref 80.0–100.0)
Monocytes Absolute: 0.5 10*3/uL (ref 0.1–1.0)
Monocytes Relative: 10 %
Neutro Abs: 1.7 10*3/uL (ref 1.7–7.7)
Neutrophils Relative %: 36 %
Platelets: 208 10*3/uL (ref 150–400)
RBC: 4.07 MIL/uL — ABNORMAL LOW (ref 4.22–5.81)
RDW: 13.9 % (ref 11.5–15.5)
WBC: 4.7 10*3/uL (ref 4.0–10.5)
nRBC: 0 % (ref 0.0–0.2)

## 2020-09-06 LAB — BASIC METABOLIC PANEL
Anion gap: 6 (ref 5–15)
BUN: 14 mg/dL (ref 8–23)
CO2: 30 mmol/L (ref 22–32)
Calcium: 9.2 mg/dL (ref 8.9–10.3)
Chloride: 103 mmol/L (ref 98–111)
Creatinine, Ser: 1.08 mg/dL (ref 0.61–1.24)
GFR, Estimated: >60 mL/min (ref >60)
Glucose, Bld: 111 mg/dL — ABNORMAL HIGH (ref 70–99)
Potassium: 3.9 mmol/L (ref 3.5–5.1)
Sodium: 139 mmol/L (ref 135–145)

## 2020-09-06 LAB — TROPONIN I (HIGH SENSITIVITY): Troponin I (High Sensitivity): 13 ng/L (ref <18)

## 2020-09-06 NOTE — ED Notes (Signed)
Pt ambulatory in ED lobby. 

## 2020-09-06 NOTE — ED Provider Notes (Signed)
[facility]-EMERGENCY DEPT Provider Note   CSN: [id number] Arrival date & time: 09/06/20  0107     History Chief Complaint  Patient presents with   Medication Reaction    Darin Roberts is a 65 y.o. male with a PMHx of HTN, lower extremity edema and tobacco use of 1/3 ppd who presents with shortness of breath and some chest pain which he attributes to a new rx of HCTZ that he has been taking for two weeks. The patient denies throat swelling, vision changes, headache, facial drooping, speech difficulty. On further questioning patient reports he has had similar chest pain and shortness of breath in the past, associated with exertion or stress and improved by rest. The patient denies history of DM, prior CVA, or family history of CAD. On evaluation, the patient reports that all of his symptoms from earlier have improved and that he is in no distress, chest pain, or shortness of breath.  HPI     History reviewed. No pertinent past medical history.  There are no problems to display for this patient.   History reviewed. No pertinent surgical history.     History reviewed. No pertinent family history.     Home Medications Prior to Admission medications   Medication Sig Start Date End Date Taking? Authorizing Provider  hydrochlorothiazide (HYDRODIURIL) 25 MG tablet Take 25 mg by mouth daily.   Yes [provider]    Allergies    Penicillins  Review of Systems   Review of Systems  Constitutional:  Negative for activity change, diaphoresis and fever.  HENT:  Negative for facial swelling, sore throat, trouble swallowing and voice change.   Eyes:  Negative for redness.  Respiratory:  Negative for cough, chest tightness, shortness of breath and wheezing.   Cardiovascular:  Positive for leg swelling. Negative for chest pain and palpitations.  Gastrointestinal:  Negative for abdominal pain, nausea and vomiting.  Genitourinary:  Negative for difficulty  urinating and dysuria.  Allergic/Immunologic: Negative for environmental allergies and food allergies.  Neurological:  Negative for dizziness, tremors, speech difficulty and weakness.  All other systems reviewed and are negative.  Physical Exam Updated Vital Signs BP (!) 149/99 (BP Location: Left Arm)   Pulse 70   Temp 98 F (36.7 C) (Oral)   Resp 18   Ht 5' 7.5" (1.715 m)   Wt 117.9 kg   SpO2 100%   BMI 40.12 kg/m   Physical Exam Vitals and nursing note reviewed.  Constitutional:      General: He is not in acute distress.    Appearance: Normal appearance.  HENT:     Head: Normocephalic and atraumatic.  Eyes:     General:        Right eye: No discharge.        Left eye: No discharge.  Cardiovascular:     Rate and Rhythm: Normal rate and regular rhythm.     Heart sounds: No murmur heard.   No friction rub. No gallop.     Comments: Pulses 2+ in all four extremities Pulmonary:     Effort: Pulmonary effort is normal.     Breath sounds: Normal breath sounds.  Abdominal:     General: Bowel sounds are normal.     Palpations: Abdomen is soft.  Musculoskeletal:     Right lower leg: Edema present.     Left lower leg: Edema present.  Skin:    General: Skin is warm and dry.     Capillary Refill:  Capillary refill takes less than 2 seconds.     Comments: Signs of chronic venous stasis in lower legs. Equal bilaterally. No ulcers or pain with palpation.  Neurological:     General: No focal deficit present.     Mental Status: He is alert and oriented to person, place, and time.     Cranial Nerves: No cranial nerve deficit.     Sensory: No sensory deficit.     Motor: No weakness.  Psychiatric:        Mood and Affect: Mood normal.        Behavior: Behavior normal.    ED Results / Procedures / Treatments   Labs (all labs ordered are listed, but only abnormal results are displayed) Labs Reviewed  BASIC METABOLIC PANEL - Abnormal; Notable for the following components:       Result Value   Glucose, Bld 111 (*)    All other components within normal limits  CBC WITH DIFFERENTIAL/PLATELET - Abnormal; Notable for the following components:   RBC 4.07 (*)    Hemoglobin 12.1 (*)    HCT 36.6 (*)    All other components within normal limits  TROPONIN I (HIGH SENSITIVITY)    EKG EKG Interpretation  Date/Time:  Tuesday September 06 2020 04:43:15 EDT Ventricular Rate:  71 PR Interval:  167 QRS Duration: 81 QT Interval:  415 QTC Calculation: 451 R Axis:   69 Text Interpretation: Sinus rhythm Confirmed by Marily Memos [phone]) on 09/06/2020 6:02:22 AM  Radiology DG Chest Portable 1 View  Result Date: 09/06/2020 CLINICAL DATA:  Chest pain and shortness of breath. EXAM: PORTABLE CHEST 1 VIEW COMPARISON:  None. FINDINGS: The heart size and mediastinal contours are within normal limits. Both lungs are clear. The visualized skeletal structures are unremarkable. IMPRESSION: No active disease. Electronically Signed   By: Obie Dredge M.D.   On: 09/06/2020 05:21    Procedures Procedures   Medications Ordered in ED Medications - No data to display  ED Course  I have reviewed the triage vital signs and the nursing notes.  Pertinent labs & imaging results that were available during my care of the patient were reviewed by me and considered in my medical decision making (see chart for details).    MDM Rules/Calculators/A&P                         Patient with improved symptoms at time of evaluation compared to initial presentation. Do not favor medication response as patient has experienced these symptoms before without the presence of his medication. No evidence of allergic reaction including rash, throat swelling, GI upset, respiratory difficulty.   Description of recurrent chest pain and shortness of breath brought on by exertion and relieved by rest is concerning for angina. HEART score of 4. ACS workup initiated but reassuring. Troponin normal, and greater than 3  hours since chest pain, CXR without abnormality, labwork reassuring. Recommend patient follow up with PCP and/or cardiology.  Final Clinical Impression(s) / ED Diagnoses Final diagnoses:  Chest pain, unspecified type  Shortness of breath    Rx / DC Orders ED Discharge Orders     None        [place] 09/06/20 3300    Marily Memos, MD 09/06/20 [phone]

## 2020-09-06 NOTE — ED Triage Notes (Signed)
Pt reports having a medication reaction to his blood pressure medication. Pt reports having a headache and shortness of breath.

## 2020-09-06 NOTE — ED Notes (Signed)
Pt refusing blood work at this time, informed pt need for blood work and what is going to be checked in bloodwork. Provider made aware, to bedside. Pt agreeable after speaking with provider.

## 2020-09-06 NOTE — ED Notes (Signed)
Blood and urine sent to lab

## 2020-09-06 NOTE — Discharge Instructions (Addendum)
As we discussed, your description of chest pain / shortness of breath that is brought on with exertion and improved with rest is concerning for coronary artery disease. Your workup today was reassuring that you are not currently having a heart attack. You should follow up with your PCP to discuss your medications and follow up with cardiology to further evaluate your heart health.

## 2020-09-07 ENCOUNTER — Other Ambulatory Visit: Payer: Self-pay

## 2020-09-07 ENCOUNTER — Emergency Department (HOSPITAL_COMMUNITY): Payer: Self-pay

## 2020-09-07 ENCOUNTER — Encounter (HOSPITAL_COMMUNITY): Payer: Self-pay

## 2020-09-07 ENCOUNTER — Emergency Department (HOSPITAL_COMMUNITY)
Admission: EM | Admit: 2020-09-07 | Discharge: 2020-09-07 | Discharge status: Against Medical Advice | Payer: Self-pay | Attending: Emergency Medicine | Admitting: Emergency Medicine

## 2020-09-07 DIAGNOSIS — M545 Low back pain, unspecified: Secondary | ICD-10-CM | POA: Insufficient documentation

## 2020-09-07 DIAGNOSIS — Z5321 Procedure and treatment not carried out due to patient leaving prior to being seen by health care provider: Secondary | ICD-10-CM | POA: Insufficient documentation

## 2020-09-07 DIAGNOSIS — W228XXA Striking against or struck by other objects, initial encounter: Secondary | ICD-10-CM | POA: Insufficient documentation

## 2020-09-07 NOTE — ED Provider Notes (Signed)
Emergency Medicine Provider Triage Evaluation Note  Jari Dipasquale , a 65 y.o. male  was evaluated in triage.  Pt complains of back pain.  Was moving a casket at work when he slipped and fell into another casket.  Impact on right lower back.  Denies head injury or LOC.  Worsening pain in low back.  No pain in the legs.  No bowel or bladder incontinence.  No prior back issues.  Review of Systems  Positive: Back pain Negative: Numbness, weakness  Physical Exam  BP (!) 148/105 (BP Location: Left Arm)   Pulse 68   Resp 16   SpO2 92%   Gen:   Awake, no distress   Resp:  Normal effort  MSK:   Moves extremities without difficulty  Other:  Locally tender right lower back without noted deformity, ambulatory in triage with steady gait  Medical Decision Making  Medically screening exam initiated at 3:52 AM.  Appropriate orders placed.  Aqil Goetting was informed that the remainder of the evaluation will be completed by another provider, this initial triage assessment does not replace that evaluation, and the importance of remaining in the ED until their evaluation is complete.  No focal deficits.  Plain films ordered.     Garlon Hatchet, PA-C 09/07/20 0400    Melene Plan, DO 09/07/20 [phone]

## 2020-09-07 NOTE — ED Triage Notes (Signed)
Ambulatory to triage - c/o Accidentally hitting lower back into another casket. Denies any legs issues.

## 2020-09-08 ENCOUNTER — Encounter (HOSPITAL_COMMUNITY): Payer: Self-pay

## 2020-09-08 ENCOUNTER — Emergency Department (HOSPITAL_COMMUNITY)
Admission: EM | Admit: 2020-09-08 | Discharge: 2020-09-08 | Disposition: A | Discharge status: Home / Self Care | Payer: Self-pay | Attending: Emergency Medicine | Admitting: Emergency Medicine

## 2020-09-08 DIAGNOSIS — Z91010 Allergy to peanuts: Secondary | ICD-10-CM | POA: Insufficient documentation

## 2020-09-08 DIAGNOSIS — F17210 Nicotine dependence, cigarettes, uncomplicated: Secondary | ICD-10-CM | POA: Insufficient documentation

## 2020-09-08 DIAGNOSIS — Y990 Civilian activity done for income or pay: Secondary | ICD-10-CM | POA: Insufficient documentation

## 2020-09-08 DIAGNOSIS — M545 Low back pain, unspecified: Secondary | ICD-10-CM

## 2020-09-08 DIAGNOSIS — W228XXA Striking against or struck by other objects, initial encounter: Secondary | ICD-10-CM | POA: Insufficient documentation

## 2020-09-08 MED ORDER — METHOCARBAMOL 500 MG PO TABS: 500.0000 mg | ORAL_TABLET | Freq: Three times a day (TID) | ORAL | 0 refills | Status: DC | PRN

## 2020-09-08 NOTE — ED Triage Notes (Signed)
Pt reports having back pain, he states that he hurt it at work last night.

## 2020-09-08 NOTE — ED Notes (Signed)
Went to D/C the pt and update vital signs, not visualized in room, appears to have left the room. Not in RR.

## 2020-09-08 NOTE — ED Provider Notes (Signed)
[facility]-EMERGENCY DEPT Provider Note   CSN: [id number] Arrival date & time: 09/08/20  0025     History Chief Complaint  Patient presents with   Back Pain    Darin Roberts is a 65 y.o. male.   Back Pain Associated symptoms: no weakness   Patient presents with right-sided low back pain.  He states that he was at work and tripped and hit his right flank/back into the corner of a casket.  States he has had pain with it.  Had waited at [facility] and had initial screening within the left.  Now here.  States the pain is resolved completely now.  No numbness weakness.  No confusion.  No hematuria.  No bruising.    History reviewed. No pertinent past medical history.  There are no problems to display for this patient.   History reviewed. No pertinent surgical history.     History reviewed. No pertinent family history.  Social History   Tobacco Use   Smoking status: Every Day    Types: Cigarettes   Smokeless tobacco: Never  Substance Use Topics   Alcohol use: Yes   Drug use: Never    Home Medications Prior to Admission medications   Medication Sig Start Date End Date Taking? Authorizing Provider  methocarbamol (ROBAXIN) 500 MG tablet Take 1 tablet (500 mg total) by mouth every 8 (eight) hours as needed for muscle spasms. 09/08/20  Yes Benjiman Core, MD  amLODipine (NORVASC) 5 MG tablet Take 1 tablet (5 mg total) by mouth daily. 08/31/20   Dione Booze, MD  furosemide (LASIX) 20 MG tablet Take 2 tablets (40 mg total) by mouth daily. 08/31/20   Dione Booze, MD  hydrochlorothiazide (HYDRODIURIL) 25 MG tablet Take 25 mg by mouth daily.    [provider]    Allergies    Cimetidine, Peanut-containing drug products, Penicillins, Tetracyclines & related, Tomato, Penicillins, and Ranitidine  Review of Systems   Review of Systems  Constitutional:  Negative for fatigue.  Respiratory:  Negative for shortness of breath.   Musculoskeletal:   Positive for back pain.  Neurological:  Negative for weakness.   Physical Exam Updated Vital Signs BP (!) 191/99 (BP Location: Right Arm)   Pulse 73   Temp 98.4 F (36.9 C) (Oral)   Resp 15   Ht 5' 10.5" (1.791 m)   Wt 117.9 kg   SpO2 93%   BMI 36.78 kg/m   Physical Exam Vitals and nursing note reviewed.  HENT:     Head: Atraumatic.  Musculoskeletal:     Cervical back: Neck supple.     Comments: Mild tenderness right posterior flank area.  No ecchymosis.  Neurological:     Mental Status: He is alert and oriented to person, place, and time.    ED Results / Procedures / Treatments   Labs (all labs ordered are listed, but only abnormal results are displayed) Labs Reviewed - No data to display  EKG None  Radiology DG Lumbar Spine Complete  Result Date: 09/07/2020 CLINICAL DATA:  Fall.  Right lower back pain. EXAM: LUMBAR SPINE - COMPLETE 4+ VIEW COMPARISON:  None. FINDINGS: Five lumbar type vertebral bodies. No acute fracture or subluxation. Vertebral body heights are preserved. Alignment is normal. Mild disc height loss at L4-L5. Remaining intervertebral disc spaces are maintained. Moderate lower lumbar facet arthropathy. The sacroiliac joints are unremarkable. IMPRESSION: 1. No acute osseous abnormality. 2. Mild degenerative disc disease at L4-L5. Electronically Signed   By: Teresita Madura  Derry M.D.   On: 09/07/2020 06:56    Procedures Procedures   Medications Ordered in ED Medications - No data to display  ED Course  I have reviewed the triage vital signs and the nursing notes.  Pertinent labs & imaging results that were available during my care of the patient were reviewed by me and considered in my medical decision making (see chart for details).    MDM Rules/Calculators/A&P                           Patient with mild blunt trauma to right posterior flank.  X-ray done yesterday and was negative.  Denies hematuria.  Will treat with symptomatic relief.  Outpatient  follow-up as needed.  Doubt severe intra-abdominal or thoracic injury.  Discharge home. Final Clinical Impression(s) / ED Diagnoses Final diagnoses:  Acute right-sided low back pain without sciatica    Rx / DC Orders ED Discharge Orders          Ordered    methocarbamol (ROBAXIN) 500 MG tablet  Every 8 hours PRN        09/08/20 0739             Benjiman Core, MD 09/08/20 [phone]

## 2020-09-12 ENCOUNTER — Encounter (HOSPITAL_COMMUNITY): Payer: Self-pay | Admitting: *Deleted

## 2020-09-12 ENCOUNTER — Emergency Department (HOSPITAL_COMMUNITY): Payer: Self-pay

## 2020-09-12 ENCOUNTER — Other Ambulatory Visit: Payer: Self-pay

## 2020-09-12 ENCOUNTER — Emergency Department (HOSPITAL_COMMUNITY)
Admission: EM | Admit: 2020-09-12 | Discharge: 2020-09-12 | Disposition: A | Discharge status: Home / Self Care | Payer: Self-pay | Attending: Emergency Medicine | Admitting: Emergency Medicine

## 2020-09-12 DIAGNOSIS — I10 Essential (primary) hypertension: Secondary | ICD-10-CM | POA: Insufficient documentation

## 2020-09-12 DIAGNOSIS — G8929 Other chronic pain: Secondary | ICD-10-CM | POA: Insufficient documentation

## 2020-09-12 DIAGNOSIS — F17210 Nicotine dependence, cigarettes, uncomplicated: Secondary | ICD-10-CM | POA: Insufficient documentation

## 2020-09-12 DIAGNOSIS — R079 Chest pain, unspecified: Secondary | ICD-10-CM | POA: Insufficient documentation

## 2020-09-12 LAB — BASIC METABOLIC PANEL
Anion gap: 7 (ref 5–15)
BUN: 13 mg/dL (ref 8–23)
CO2: 29 mmol/L (ref 22–32)
Calcium: 9.3 mg/dL (ref 8.9–10.3)
Chloride: 104 mmol/L (ref 98–111)
Creatinine, Ser: 1.12 mg/dL (ref 0.61–1.24)
GFR, Estimated: >60 mL/min (ref >60)
Glucose, Bld: 111 mg/dL — ABNORMAL HIGH (ref 70–99)
Potassium: 4.3 mmol/L (ref 3.5–5.1)
Sodium: 140 mmol/L (ref 135–145)

## 2020-09-12 LAB — CBC
HCT: 38.4 % — ABNORMAL LOW (ref 39.0–52.0)
Hemoglobin: 12.4 g/dL — ABNORMAL LOW (ref 13.0–17.0)
MCH: 29.5 pg (ref 26.0–34.0)
MCHC: 32.3 g/dL (ref 30.0–36.0)
MCV: 91.2 fL (ref 80.0–100.0)
Platelets: 229 10*3/uL (ref 150–400)
RBC: 4.21 MIL/uL — ABNORMAL LOW (ref 4.22–5.81)
RDW: 13.8 % (ref 11.5–15.5)
WBC: 4.1 10*3/uL (ref 4.0–10.5)
nRBC: 0 % (ref 0.0–0.2)

## 2020-09-12 LAB — TROPONIN I (HIGH SENSITIVITY): Troponin I (High Sensitivity): 16 ng/L (ref <18)

## 2020-09-12 NOTE — Discharge Instructions (Addendum)
You were evaluated in the Emergency Department and after careful evaluation, we did not find any emergent condition requiring admission or further testing in the hospital.  Your exam/testing today was overall reassuring.  Please return to the Emergency Department if you experience any worsening of your condition.  Thank you for allowing us to be a part of your care.  

## 2020-09-12 NOTE — ED Notes (Signed)
Pt does not want to be stuck in the hand again

## 2020-09-12 NOTE — ED Triage Notes (Signed)
Pt c/o chest pain that began 3 hours ago.  Pain 2/10.  Pain increases with ambulation.

## 2020-09-12 NOTE — ED Provider Notes (Signed)
[facility]-EMERGENCY DEPT [facility] Emergency Department Provider Note MRN:  [id number]  Arrival date & time: 09/12/20     Chief Complaint   Chest Pain   History of Present Illness   Darin Roberts is a 65 y.o. year-old male with a history of hypertension, chronic venous insufficiency of the legs presenting to the ED with chief complaint of chest pain.  Chest pain intermittently for a few months, worse this evening.  Center of the chest, described as being kicked.  Denies shortness of breath, no changes to the legs, has chronic swelling.  No other complaints.  Denies fever or cough.  Review of Systems  A complete 10 system review of systems was obtained and all systems are negative except as noted in the HPI and PMH.   Patient's Health History   History reviewed. No pertinent past medical history.  History reviewed. No pertinent surgical history.  No family history on file.  Social History   Socioeconomic History   Marital status: Divorced    Spouse name: Not on file   Number of children: Not on file   Years of education: Not on file   Highest education level: Not on file  Occupational History   Not on file  Tobacco Use   Smoking status: Every Day    Types: Cigarettes   Smokeless tobacco: Never  Substance and Sexual Activity   Alcohol use: Yes   Drug use: Never   Sexual activity: Not on file  Other Topics Concern   Not on file  Social History Narrative   ** Merged History Encounter **       Social Determinants of Health   Financial Resource Strain: Not on file  Food Insecurity: Not on file  Transportation Needs: Not on file  Physical Activity: Not on file  Stress: Not on file  Social Connections: Not on file  Intimate Partner Violence: Not on file     Physical Exam   Vitals:   09/12/20 0445 09/12/20 0515  BP: (!) 147/88 (!) 168/91  Pulse: 74 77  Resp: 14 11  Temp:    SpO2: 95% 98%    CONSTITUTIONAL: Chronically ill-appearing, NAD NEURO:  Alert and  oriented x 3, no focal deficits EYES:  eyes equal and reactive ENT/NECK:  no LAD, no JVD CARDIO: Regular rate, well-perfused, normal S1 and S2 PULM:  CTAB no wheezing or rhonchi GI/GU:  normal bowel sounds, non-distended, non-tender MSK/SPINE: Pitting edema to bilateral lower extremities SKIN:  no rash, atraumatic PSYCH:  Appropriate speech and behavior  *Additional and/or pertinent findings included in MDM below  Diagnostic and Interventional Summary    EKG Interpretation  Date/Time:  Monday September 12 2020 02:51:49 EDT Ventricular Rate:  76 PR Interval:  168 QRS Duration: 80 QT Interval:  378 QTC Calculation: 425 R Axis:   43 Text Interpretation: Normal sinus rhythm Normal ECG No significant change was found Confirmed by Kennis Carina [phone]) on 09/12/2020 3:47:46 AM       Labs Reviewed  BASIC METABOLIC PANEL - Abnormal; Notable for the following components:      Result Value   Glucose, Bld 111 (*)    All other components within normal limits  CBC - Abnormal; Notable for the following components:   RBC 4.21 (*)    Hemoglobin 12.4 (*)    HCT 38.4 (*)    All other components within normal limits  TROPONIN I (HIGH SENSITIVITY)  TROPONIN I (HIGH SENSITIVITY)    DG Chest 2 View  Final Result      Medications - No data to display   Procedures  /  Critical Care Procedures  ED Course and Medical Decision Making  I have reviewed the triage vital signs, the nursing notes, and pertinent available records from the EMR.  Listed above are laboratory and imaging tests that I personally ordered, reviewed, and interpreted and then considered in my medical decision making (see below for details).  Acute on chronic chest pain, frequent ED visitor.  Considering ACS, very low concern for PE given lack of shortness of breath, no tachycardia, no hypoxia, no crease work of breathing.  Sitting comfortably, normal vital signs, awaiting troponin and will reassess.     Work-up is  reassuring with negative troponin.  Second troponin suggested the patient declines, wishes to go home and follow-up with cardiology as an outpatient.  Pain started several hours prior to arrival and so technically no definite need for repeat.  Nothing to suggest emergent process, appropriate for discharge.  Elmer Sow. Pilar Plate, MD [facility] Emergency Medicine [facility] [email or web address]  Final Clinical Impressions(s) / ED Diagnoses     ICD-10-CM   1. Chest pain, unspecified type  R07.9       ED Discharge Orders          Ordered    Ambulatory referral to Cardiology        09/12/20 0602             Discharge Instructions Discussed with and Provided to Patient:     Discharge Instructions      You were evaluated in the Emergency Department and after careful evaluation, we did not find any emergent condition requiring admission or further testing in the hospital.  Your exam/testing today was overall reassuring.  Please return to the Emergency Department if you experience any worsening of your condition.  Thank you for allowing [place] to be a part of your care.         Sabas Sous, MD 09/12/20 [phone]

## 2020-09-14 ENCOUNTER — Emergency Department (HOSPITAL_COMMUNITY)
Admission: EM | Admit: 2020-09-14 | Discharge: 2020-09-14 | Disposition: A | Discharge status: Home / Self Care | Payer: Self-pay | Attending: Emergency Medicine | Admitting: Emergency Medicine

## 2020-09-14 ENCOUNTER — Other Ambulatory Visit: Payer: Self-pay

## 2020-09-14 ENCOUNTER — Encounter (HOSPITAL_COMMUNITY): Payer: Self-pay

## 2020-09-14 DIAGNOSIS — F17210 Nicotine dependence, cigarettes, uncomplicated: Secondary | ICD-10-CM | POA: Insufficient documentation

## 2020-09-14 DIAGNOSIS — I10 Essential (primary) hypertension: Secondary | ICD-10-CM | POA: Insufficient documentation

## 2020-09-14 NOTE — ED Provider Notes (Signed)
[facility]-EMERGENCY DEPT [facility] Emergency Department Provider Note MRN:  [id number]  Arrival date & time: 09/14/20     Chief Complaint   Blood Pressure Check   History of Present Illness   Darin Roberts is a 64 y.o. year-old male with a history of hypertension presenting to the ED with chief complaint of blood pressure check.  Patient here just to have his blood pressure checked.  Denies any symptoms.  No chest pain, no headache, no vision change.  Swelling to the legs which is unchanged.  Review of Systems  A problem-focused ROS was performed. Positive for high blood pressure.  Patient denies chest.  Patient's Health History   History reviewed. No pertinent past medical history.  History reviewed. No pertinent surgical history.  History reviewed. No pertinent family history.  Social History   Socioeconomic History   Marital status: Divorced    Spouse name: Not on file   Number of children: Not on file   Years of education: Not on file   Highest education level: Not on file  Occupational History   Not on file  Tobacco Use   Smoking status: Every Day    Types: Cigarettes   Smokeless tobacco: Never  Substance and Sexual Activity   Alcohol use: Yes   Drug use: Never   Sexual activity: Not on file  Other Topics Concern   Not on file  Social History Narrative   ** Merged History Encounter **       Social Determinants of Health   Financial Resource Strain: Not on file  Food Insecurity: Not on file  Transportation Needs: Not on file  Physical Activity: Not on file  Stress: Not on file  Social Connections: Not on file  Intimate Partner Violence: Not on file     Physical Exam   Vitals:   09/14/20 0132  BP: (!) 176/91  Pulse: 73  Resp: 16  Temp: (!) 97.5 F (36.4 C)  SpO2: 94%    CONSTITUTIONAL: Well-appearing, NAD NEURO:  Alert and oriented x 3, no focal deficits EYES:  eyes equal and reactive ENT/NECK:  no LAD, no JVD CARDIO: Rate rate,  well-perfused, normal S1 and S2 PULM:  CTAB no wheezing or rhonchi GI/GU:  normal bowel sounds, non-distended, non-tender MSK/SPINE:  No gross deformities, no edema SKIN:  no rash, atraumatic PSYCH:  Appropriate speech and behavior  *Additional and/or pertinent findings included in MDM below  Diagnostic and Interventional Summary    EKG Interpretation  Date/Time:    Ventricular Rate:    PR Interval:    QRS Duration:   QT Interval:    QTC Calculation:   R Axis:     Text Interpretation:         Labs Reviewed - No data to display  No orders to display    Medications - No data to display   Procedures  /  Critical Care Procedures  ED Course and Medical Decision Making  I have reviewed the triage vital signs, the nursing notes, and pertinent available records from the EMR.  Listed above are laboratory and imaging tests that I personally ordered, reviewed, and interpreted and then considered in my medical decision making (see below for details).  Asymptomatic hypertension, patient is relieved by the blood pressure reading here in the emergency department, has no other complaints or requests.  Becoming a very frequent ED visitor.  Nothing to suggest emergent process, appropriate for discharge.       Elmer Sow. Pilar Plate, MD [facility] Emergency [facility] [email or web address]  Final Clinical Impressions(s) / ED Diagnoses     ICD-10-CM   1. Hypertension, unspecified type  I10       ED Discharge Orders     None        Discharge Instructions Discussed with and Provided to Patient:    Discharge Instructions      You were evaluated in the Emergency Department and after careful evaluation, we did not find any emergent condition requiring admission or further testing in the hospital.  Your exam/testing today was overall reassuring.  Please return to the Emergency Department if you experience any worsening of your condition.  Thank you for  allowing [place] to be a part of your care.        Sabas Sous, MD 09/14/20 [phone]

## 2020-09-14 NOTE — ED Triage Notes (Signed)
Pt reports wanting a blood pressure check.

## 2020-09-14 NOTE — Discharge Instructions (Addendum)
You were evaluated in the Emergency Department and after careful evaluation, we did not find any emergent condition requiring admission or further testing in the hospital.  Your exam/testing today was overall reassuring.  Please return to the Emergency Department if you experience any worsening of your condition.  Thank you for allowing us to be a part of your care.  

## 2020-09-15 NOTE — Progress Notes (Deleted)
Cardiology Office Note   Date:  09/15/2020   ID:  Kadien Lineman, DOB 03-13-1955, MRN [id number]  PCP:  Lavinia Sharps, NP  Cardiologist:   Dusti Tetro [place], MD   No chief complaint on file.     History of Present Illness: Darin Roberts is a 65 y.o. male who is seen at the request of Dr Pilar Plate for evaluation of chest pain. He has a history of HTN.     No past medical history on file.  No past surgical history on file.   Current Outpatient Medications  Medication Sig Dispense Refill   acetaminophen (TYLENOL) 500 MG tablet Take 1,000 mg by mouth every 6 (six) hours as needed for moderate pain or headache.     amLODipine (NORVASC) 5 MG tablet Take 1 tablet (5 mg total) by mouth daily. 30 tablet 0   furosemide (LASIX) 20 MG tablet Take 2 tablets (40 mg total) by mouth daily. 30 tablet 0   hydrochlorothiazide (HYDRODIURIL) 25 MG tablet Take 25 mg by mouth daily.     methocarbamol (ROBAXIN) 500 MG tablet Take 1 tablet (500 mg total) by mouth every 8 (eight) hours as needed for muscle spasms. 8 tablet 0   No current facility-administered medications for this visit.    Allergies:   Cimetidine, Other, Peanut-containing drug products, Penicillins, Tetracyclines & related, Tomato, Penicillins, and Ranitidine    Social History:  The patient  reports that he has been smoking cigarettes. He has never used smokeless tobacco. He reports current alcohol use. He reports that he does not use drugs.   Family History:  The patient's ***family history is not on file.    ROS:  Please see the history of present illness.   Otherwise, review of systems are positive for {NONE DEFAULTED:18576}.   All other systems are reviewed and negative.    PHYSICAL EXAM: VS:  There were no vitals taken for this visit. , BMI There is no height or weight on file to calculate BMI. GEN: Well nourished, well developed, in no acute distress HEENT: normal Neck: no JVD, carotid bruits, or masses Cardiac: ***RRR; no  murmurs, rubs, or gallops,no edema  Respiratory:  clear to auscultation bilaterally, normal work of breathing GI: soft, nontender, nondistended, + BS MS: no deformity or atrophy Skin: warm and dry, no rash Neuro:  Strength and sensation are intact Psych: euthymic mood, full affect   EKG:  EKG {ACTION; IS/IS HGD:[id number]} ordered today. The ekg ordered today demonstrates ***   Recent Labs: 08/24/2020: ALT 19 09/03/2020: B Natriuretic Peptide 27.5 09/12/2020: BUN 13; Creatinine, Ser 1.12; Hemoglobin 12.4; Platelets 229; Potassium 4.3; Sodium 140    Lipid Panel No results found for: CHOL, TRIG, HDL, CHOLHDL, VLDL, LDLCALC, LDLDIRECT    Wt Readings from Last 3 Encounters:  09/08/20 260 lb (117.9 kg)  09/06/20 260 lb (117.9 kg)  08/30/20 264 lb (119.7 kg)      Other studies Reviewed: Additional studies/ records that were reviewed today include: ***. Review of the above records demonstrates: ***   ASSESSMENT AND PLAN:  1.  ***   Current medicines are reviewed at length with the patient today.  The patient {ACTIONS; HAS/DOES NOT HAVE:19233} concerns regarding medicines.  The following changes have been made:  {PLAN; NO CHANGE:13088:s}  Labs/ tests ordered today include: *** No orders of the defined types were placed in this encounter.    Disposition:   FU with *** in {gen number 1-96:222979} {Days to years:10300}  Signed, Barbarann Kelly [place],  MD  09/15/2020 12:20 PM    [facility] [address], [place], [place], [address] Phone [phone], Fax [phone]

## 2020-09-16 ENCOUNTER — Ambulatory Visit: Payer: Self-pay | Admitting: Cardiology

## 2020-09-21 ENCOUNTER — Emergency Department (HOSPITAL_COMMUNITY): Payer: Self-pay

## 2020-09-21 ENCOUNTER — Emergency Department (HOSPITAL_COMMUNITY)
Admission: EM | Admit: 2020-09-21 | Discharge: 2020-09-21 | Disposition: A | Discharge status: Home / Self Care | Payer: Self-pay | Attending: Emergency Medicine | Admitting: Emergency Medicine

## 2020-09-21 DIAGNOSIS — M545 Low back pain, unspecified: Secondary | ICD-10-CM | POA: Insufficient documentation

## 2020-09-21 DIAGNOSIS — F17210 Nicotine dependence, cigarettes, uncomplicated: Secondary | ICD-10-CM | POA: Insufficient documentation

## 2020-09-21 DIAGNOSIS — Z91010 Allergy to peanuts: Secondary | ICD-10-CM | POA: Insufficient documentation

## 2020-09-21 MED ORDER — METHOCARBAMOL 500 MG PO TABS: 500.0000 mg | ORAL_TABLET | Freq: Three times a day (TID) | ORAL | 0 refills | Status: AC | PRN

## 2020-09-21 NOTE — ED Triage Notes (Addendum)
Pt c/o R sided back pain, states he fell tonight & hit back. Denies hitting head, denies LOC, no thinners. Pt states OTC tylenol helped bring pain down to 3/10, states he is just sore now. Pt seen multiple times recently for same

## 2020-09-21 NOTE — ED Provider Notes (Signed)
[name] EMERGENCY DEPARTMENT Provider Note   CSN: [id number] Arrival date & time: 09/21/20  0343     History Chief Complaint  Patient presents with   Back Pain    Darin Roberts is a 65 y.o. male with a history of chronic back pain presents for acute on chronic right lower back pain.  Patient reports he got up around 2:30 AM to use the bathroom and stumbled falling and striking his right back.  Patient reports that his nighttime medication sometimes he gets dizzy and this is not new.  He reports this has contributed to falls in the past.  Patient reports he took Tylenol with improvement in his back pain but wanted to get checked to ensure no injury.  Denies new or worsening numbness or tingling in his feet.  Fully denies radiation of back pain, loss of bowel or bladder control, difficulty walking.  Movement and palpation make the symptoms slightly worse.  Tylenol better.  No other concerns today.   The history is provided by the patient and medical records. No language interpreter was used.      No past medical history on file.  There are no problems to display for this patient.   No past surgical history on file.     No family history on file.  Social History   Tobacco Use   Smoking status: Every Day    Types: Cigarettes   Smokeless tobacco: Never  Substance Use Topics   Alcohol use: Yes   Drug use: Never    Home Medications Prior to Admission medications   Medication Sig Start Date End Date Taking? Authorizing Provider  acetaminophen (TYLENOL) 500 MG tablet Take 1,000 mg by mouth every 6 (six) hours as needed for moderate pain or headache.    [provider]  amLODipine (NORVASC) 5 MG tablet Take 1 tablet (5 mg total) by mouth daily. 08/31/20   Dione Booze, MD  furosemide (LASIX) 20 MG tablet Take 2 tablets (40 mg total) by mouth daily. 08/31/20   Dione Booze, MD  hydrochlorothiazide (HYDRODIURIL) 25 MG tablet Take 25 mg by mouth daily.     [provider]  methocarbamol (ROBAXIN) 500 MG tablet Take 1 tablet (500 mg total) by mouth every 8 (eight) hours as needed for muscle spasms. 09/21/20   Dionta Larke, Dahlia Client, PA-C    Allergies    Cimetidine, Other, Peanut-containing drug products, Penicillins, Tetracyclines & related, Tomato, Penicillins, and Ranitidine  Review of Systems   Review of Systems  Constitutional:  Negative for chills and fever.  HENT:  Negative for facial swelling.   Eyes:  Negative for visual disturbance.  Respiratory:  Negative for chest tightness and shortness of breath.   Cardiovascular:  Negative for chest pain.  Gastrointestinal:  Negative for abdominal pain.  Musculoskeletal:  Positive for back pain.  Skin:  Negative for wound.  Allergic/Immunologic: Negative for immunocompromised state.  Neurological:  Negative for headaches.  Hematological:  Does not bruise/bleed easily.  Psychiatric/Behavioral:  The patient is not nervous/anxious.    Physical Exam Updated Vital Signs BP (!) 172/109 (BP Location: Right Arm)   Pulse 75   Temp 98.1 F (36.7 C) (Oral)   Resp 17   SpO2 100%   Physical Exam Vitals and nursing note reviewed.  Constitutional:      General: He is not in acute distress.    Appearance: He is well-developed. He is not diaphoretic.  HENT:     Head: Normocephalic and atraumatic.  Eyes:     Conjunctiva/sclera: Conjunctivae normal.  Neck:     Comments: Full ROM without pain No midline or paraspinal tenderness Cardiovascular:     Rate and Rhythm: Normal rate and regular rhythm.  Pulmonary:     Effort: Pulmonary effort is normal. No respiratory distress.  Abdominal:     General: There is no distension.     Palpations: Abdomen is soft.     Tenderness: There is no abdominal tenderness.  Musculoskeletal:     Cervical back: Normal, normal range of motion and neck supple.     Thoracic back: Normal. No spasms, tenderness or bony tenderness.     Lumbar back: Tenderness  (right paraspinal tenderness) present. No bony tenderness. Normal range of motion.     Comments: Full range of motion of the T-spine and L-spine No midline tenderness to the  T-spine or L-spine Right sided tenderness to palpation of the paraspinous muscles of the L-spine  Skin:    General: Skin is warm and dry.     Findings: No erythema or rash.  Neurological:     Mental Status: He is alert.     Comments: Speech is clear and goal oriented, follows commands Normal 5/5 strength in upper and lower extremities bilaterally including dorsiflexion and plantar flexion, strong and equal grip strength Sensation normal to light and sharp touch Moves extremities without ataxia, coordination intact Normal gait Normal balance No Clonus   Psychiatric:        Behavior: Behavior normal.    ED Results / Procedures / Treatments   Labs (all labs ordered are listed, but only abnormal results are displayed) Labs Reviewed - No data to display   Radiology DG Lumbar Spine Complete  Result Date: 09/21/2020 CLINICAL DATA:  Back pain after a fall. EXAM: LUMBAR SPINE - COMPLETE 4+ VIEW COMPARISON:  None. FINDINGS: There is no evidence of lumbar spine fracture. Alignment is normal. Intervertebral disc spaces are maintained. IMPRESSION: Negative. Electronically Signed   By: Kennith Center M.D.   On: 09/21/2020 05:12    Procedures Procedures   Medications Ordered in ED Medications - No data to display  ED Course  I have reviewed the triage vital signs and the nursing notes.  Pertinent labs & imaging results that were available during my care of the patient were reviewed by me and considered in my medical decision making (see chart for details).  Clinical Course as of 09/21/20 2671  Wed Sep 21, 2020  0408 BP(!): 172/109 Hx of same - often elevated in the ED [HM]    Clinical Course User Index [HM] Tynan Boesel, Boyd Kerbs   MDM Rules/Calculators/A&P                           Presents with  right-sided paraspinal back pain after fall.  Patient reports that his medications often make him dizzy and he loses his balance and falls.  Denies hitting his head or loss of consciousness, chest pain, shortness of breath, syncope.  Pain improved with Tylenol.  Will obtain x-ray to ensure no fracture.  4:28 AM Patient reports he is feeling better.  X-ray without evidence of fracture.  Patient reports he needs a refill on his muscle relaxer as this often helps significantly.  Patient is taking Robaxin.  Refill given.  Patient remains ambulatory without difficulty.  BP (!) 164/102 (BP Location: Right Arm)   Pulse 81   Temp 97.6 F (36.4 C) (Oral)  Resp 17   SpO2 97%    Final Clinical Impression(s) / ED Diagnoses Final diagnoses:  Acute right-sided low back pain without sciatica    Rx / DC Orders ED Discharge Orders          Ordered    methocarbamol (ROBAXIN) 500 MG tablet  Every 8 hours PRN        09/21/20 0637             Justiss Gerbino, Dahlia Client, PA-C 09/21/20 0865    Dione Booze, MD 09/21/20 [phone]

## 2020-09-21 NOTE — Discharge Instructions (Addendum)
1. Medications: tylenol for pain control at home, usual home medications 2. Treatment: rest, drink plenty of fluids, gentle stretching as discussed, alternate ice and heat 3. Follow Up: Please followup with your primary doctor in 3 days for discussion of your diagnoses and further evaluation after today's visit; if you do not have a primary care doctor use the resource guide provided to find one;  Return to the ER for worsening back pain, difficulty walking, loss of bowel or bladder control or other concerning symptoms

## 2020-09-23 ENCOUNTER — Emergency Department (HOSPITAL_BASED_OUTPATIENT_CLINIC_OR_DEPARTMENT_OTHER): Payer: Self-pay

## 2020-09-23 ENCOUNTER — Emergency Department (HOSPITAL_BASED_OUTPATIENT_CLINIC_OR_DEPARTMENT_OTHER)
Admission: EM | Admit: 2020-09-23 | Discharge: 2020-09-23 | Disposition: A | Discharge status: Home / Self Care | Payer: Self-pay | Attending: Emergency Medicine | Admitting: Emergency Medicine

## 2020-09-23 ENCOUNTER — Encounter (HOSPITAL_BASED_OUTPATIENT_CLINIC_OR_DEPARTMENT_OTHER): Payer: Self-pay

## 2020-09-23 ENCOUNTER — Other Ambulatory Visit: Payer: Self-pay

## 2020-09-23 DIAGNOSIS — Z79899 Other long term (current) drug therapy: Secondary | ICD-10-CM | POA: Insufficient documentation

## 2020-09-23 DIAGNOSIS — R0602 Shortness of breath: Secondary | ICD-10-CM | POA: Insufficient documentation

## 2020-09-23 DIAGNOSIS — I10 Essential (primary) hypertension: Secondary | ICD-10-CM | POA: Insufficient documentation

## 2020-09-23 DIAGNOSIS — R062 Wheezing: Secondary | ICD-10-CM | POA: Insufficient documentation

## 2020-09-23 DIAGNOSIS — F17210 Nicotine dependence, cigarettes, uncomplicated: Secondary | ICD-10-CM | POA: Insufficient documentation

## 2020-09-23 DIAGNOSIS — Z91010 Allergy to peanuts: Secondary | ICD-10-CM | POA: Insufficient documentation

## 2020-09-23 DIAGNOSIS — R059 Cough, unspecified: Secondary | ICD-10-CM | POA: Insufficient documentation

## 2020-09-23 MED ORDER — PREDNISONE 50 MG PO TABS
60.0000 mg | ORAL_TABLET | Freq: Once | ORAL | Status: AC
  Administered 2020-09-23: 60 mg via ORAL
  Filled 2020-09-23: qty 1

## 2020-09-23 MED ORDER — ALBUTEROL SULFATE (2.5 MG/3ML) 0.083% IN NEBU
5.0000 mg | INHALATION_SOLUTION | Freq: Once | RESPIRATORY_TRACT | Status: AC
  Administered 2020-09-23: 5 mg via RESPIRATORY_TRACT
  Filled 2020-09-23: qty 6

## 2020-09-23 MED ORDER — PREDNISONE 50 MG PO TABS: ORAL_TABLET | ORAL | 0 refills | Status: AC

## 2020-09-23 MED ORDER — IPRATROPIUM BROMIDE 0.02 % IN SOLN
0.5000 mg | Freq: Once | RESPIRATORY_TRACT | Status: AC
  Administered 2020-09-23: 0.5 mg via RESPIRATORY_TRACT
  Filled 2020-09-23: qty 2.5

## 2020-09-23 MED ORDER — ALBUTEROL SULFATE HFA 108 (90 BASE) MCG/ACT IN AERS
2.0000 | INHALATION_SPRAY | Freq: Once | RESPIRATORY_TRACT | Status: AC
  Administered 2020-09-23: 2 via RESPIRATORY_TRACT
  Filled 2020-09-23: qty 6.7

## 2020-09-23 MED ORDER — AEROCHAMBER PLUS FLO-VU MISC
1.0000 | Freq: Once | Status: AC
  Administered 2020-09-23: 1
  Filled 2020-09-23: qty 1

## 2020-09-23 NOTE — ED Provider Notes (Signed)
[facility] EMERGENCY DEPT Provider Note   CSN: [id number] Arrival date & time: 09/23/20  0102     History Chief Complaint  Patient presents with   Shortness of Breath    Darin Roberts is a 65 y.o. male.  The history is provided by the patient.  Shortness of Breath Severity:  Moderate Onset quality:  Gradual Duration:  1 hour Timing:  Constant Progression:  Unchanged Chronicity:  New Relieved by:  Nothing Worsened by:  Nothing Associated symptoms: cough   Associated symptoms: no chest pain, no fever, no hemoptysis and no sputum production   Patient presents with cough and shortness of breath that start about an hour ago.  He reports it began after work.  No chest pain.  No fever.  No hemoptysis.  He reports he may have had a panic attack.  Patient reports he is still smoking cigarette   Patient denies any recent dyspnea exertion.  He had otherwise been at his baseline prior to this  PMH- HTN Soc hx - smokes 5 cigarettes/day Social History   Tobacco Use   Smoking status: Every Day    Types: Cigarettes   Smokeless tobacco: Never  Substance Use Topics   Alcohol use: Yes   Drug use: Never    Home Medications Prior to Admission medications   Medication Sig Start Date End Date Taking? Authorizing Provider  predniSONE (DELTASONE) 50 MG tablet 1 tablet PO QD X4 days 09/23/20  Yes Zadie Rhine, MD  acetaminophen (TYLENOL) 500 MG tablet Take 1,000 mg by mouth every 6 (six) hours as needed for moderate pain or headache.    [provider]  amLODipine (NORVASC) 5 MG tablet Take 1 tablet (5 mg total) by mouth daily. 08/31/20   Dione Booze, MD  furosemide (LASIX) 20 MG tablet Take 2 tablets (40 mg total) by mouth daily. 08/31/20   Dione Booze, MD  hydrochlorothiazide (HYDRODIURIL) 25 MG tablet Take 25 mg by mouth daily.    [provider]  methocarbamol (ROBAXIN) 500 MG tablet Take 1 tablet (500 mg total) by mouth every 8 (eight) hours as needed  for muscle spasms. 09/21/20   Muthersbaugh, Dahlia Client, PA-C    Allergies    Cimetidine, Other, Peanut-containing drug products, Penicillins, Tetracyclines & related, Tomato, Penicillins, and Ranitidine  Review of Systems   Review of Systems  Constitutional:  Negative for fever.  Respiratory:  Positive for cough and shortness of breath. Negative for hemoptysis and sputum production.   Cardiovascular:  Positive for leg swelling. Negative for chest pain.  All other systems reviewed and are negative.  Physical Exam Updated Vital Signs BP (!) 150/89   Pulse 64   Temp 98.3 F (36.8 C) (Oral)   Resp 20   Ht 1.778 m (5\' 10" )   Wt 117 kg   SpO2 98%   BMI 37.01 kg/m   Physical Exam CONSTITUTIONAL: Well developed/well nourished, no distress HEAD: Normocephalic/atraumatic EYES: EOMI/PERRL ENMT: Mucous membranes moist NECK: supple no meningeal signs SPINE/BACK:entire spine nontender CV: S1/S2 noted, no murmurs/rubs/gallops noted LUNGS: Scattered wheeze bilaterally, no acute distress, no crackles ABDOMEN: soft, nontender NEURO: Pt is awake/alert/appropriate, moves all extremitiesx4.  No facial droop.   EXTREMITIES: pulses normal/equal, full ROM, chronic lower extremity edema is noted SKIN: warm, color normal PSYCH: no abnormalities of mood noted, alert and oriented to situation  ED Results / Procedures / Treatments   Labs (all labs ordered are listed, but only abnormal results are displayed) Labs Reviewed - No data to display  EKG EKG Interpretation  Date/Time:  Friday September 23 2020 01:08:16 EDT Ventricular Rate:  74 PR Interval:  158 QRS Duration: 76 QT Interval:  400 QTC Calculation: 444 R Axis:   65 Text Interpretation: Normal sinus rhythm Normal ECG Confirmed by Zadie Rhine ([id number]) on 09/23/2020 1:17:22 AM  Radiology DG Lumbar Spine Complete  Result Date: 09/21/2020 CLINICAL DATA:  Back pain after a fall. EXAM: LUMBAR SPINE - COMPLETE 4+ VIEW COMPARISON:  None.  FINDINGS: There is no evidence of lumbar spine fracture. Alignment is normal. Intervertebral disc spaces are maintained. IMPRESSION: Negative. Electronically Signed   By: Kennith Center M.D.   On: 09/21/2020 05:12   DG Chest Portable 1 View  Result Date: 09/23/2020 CLINICAL DATA:  Shortness of breath. EXAM: PORTABLE CHEST 1 VIEW COMPARISON:  September 12, 2020 FINDINGS: Mild, chronic appearing increased lung markings are seen. There is no evidence of acute infiltrate, pleural effusion or pneumothorax. The heart size and mediastinal contours are within normal limits. The visualized skeletal structures are unremarkable. IMPRESSION: No active cardiopulmonary disease. Electronically Signed   By: Aram Candela M.D.   On: 09/23/2020 01:46    Procedures Procedures   Medications Ordered in ED Medications  albuterol (VENTOLIN HFA) 108 (90 Base) MCG/ACT inhaler 2 puff (has no administration in time range)  albuterol (PROVENTIL) (2.5 MG/3ML) 0.083% nebulizer solution 5 mg (5 mg Nebulization Given 09/23/20 0142)  ipratropium (ATROVENT) nebulizer solution 0.5 mg (0.5 mg Nebulization Given 09/23/20 0143)  predniSONE (DELTASONE) tablet 60 mg (60 mg Oral Given 09/23/20 0134)    ED Course  I have reviewed the triage vital signs and the nursing notes.  Pertinent imaging results that were available during my care of the patient were reviewed by me and considered in my medical decision making (see chart for details).    MDM Rules/Calculators/A&P                           Patient presents with 16 visit 6 months.  Tonight's visit concerns cough and shortness of breath.  Patient has very mild wheeze but overall no acute distress. X-ray was reviewed and is negative. 2:21 AM Patient resting comfortably.  Wheezing is improved. Plan to discharge home.  Patient admits that he lives in [place].  He reports he has a primary doctor.  I advised close follow-up with PCP due to extensive ER evaluations in the past several  months However he is in no distress, low suspicion for ACS/PE at this time.  No signs of CHF. Patient counseled on stopping smoking Final Clinical Impression(s) / ED Diagnoses Final diagnoses:  Wheezing    Rx / DC Orders ED Discharge Orders          Ordered    predniSONE (DELTASONE) 50 MG tablet        09/23/20 0221             Zadie Rhine, MD 09/23/20 0222

## 2020-09-23 NOTE — ED Triage Notes (Signed)
Pt BIB [facility]- pt c/o SOB that started ah hr ago -  PMX  -  HTN , Asthma - denies CP   GCS 15

## 2020-09-23 NOTE — ED Notes (Signed)
RT educated pt on proper use of MDI w/aerochamber pt able to perform without difficulty.

## 2020-09-23 NOTE — Discharge Instructions (Addendum)
Please follow-up with your primary doctor next week for full evaluation

## 2021-08-22 IMAGING — CR DG CHEST 2V
2 series · 2 of 2 positions shown · non-contrast
Comparison: Chest radiograph dated 09/03/2020

CLINICAL DATA: Chest pain.

EXAM:
CHEST - 2 VIEW

[chest pa]
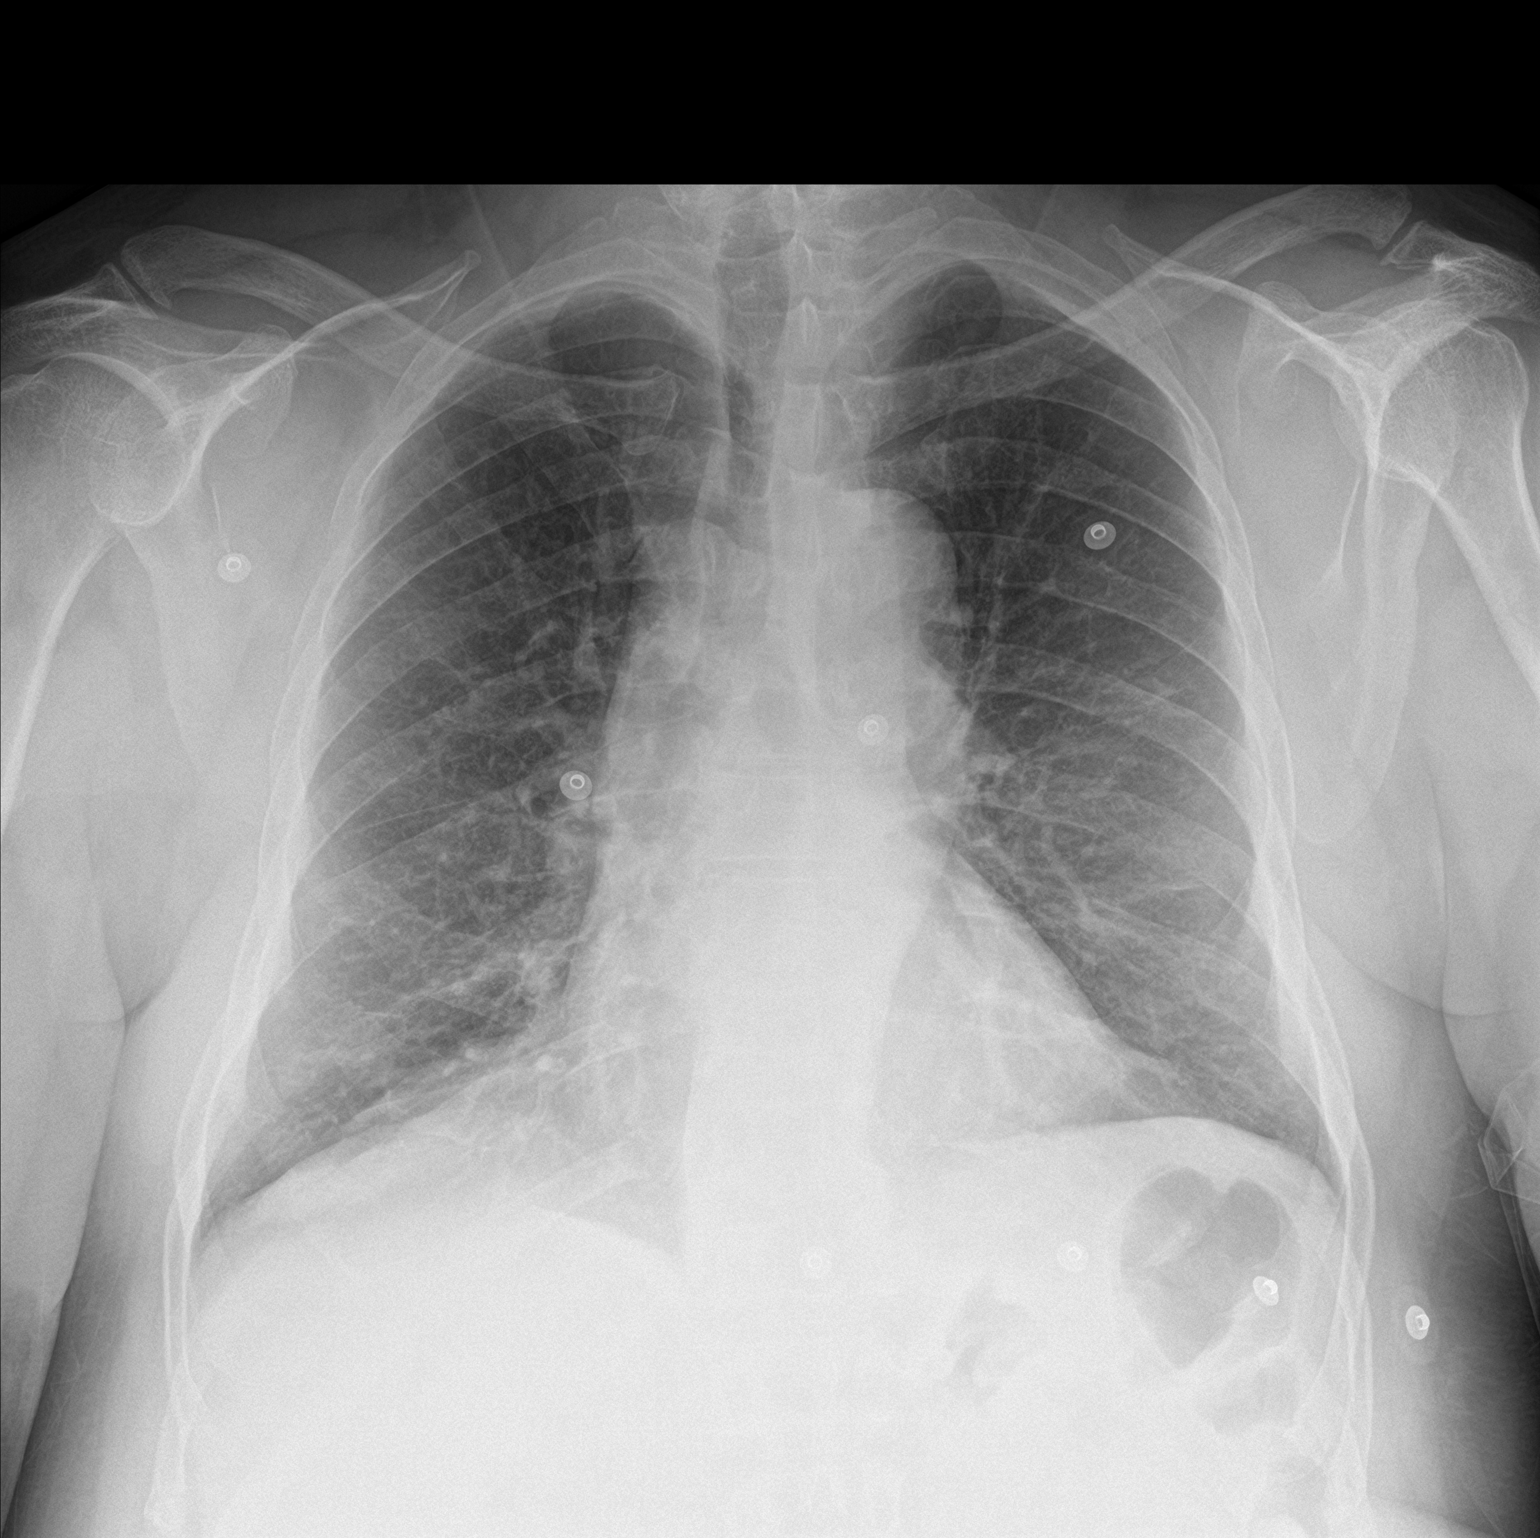

[chest lat]
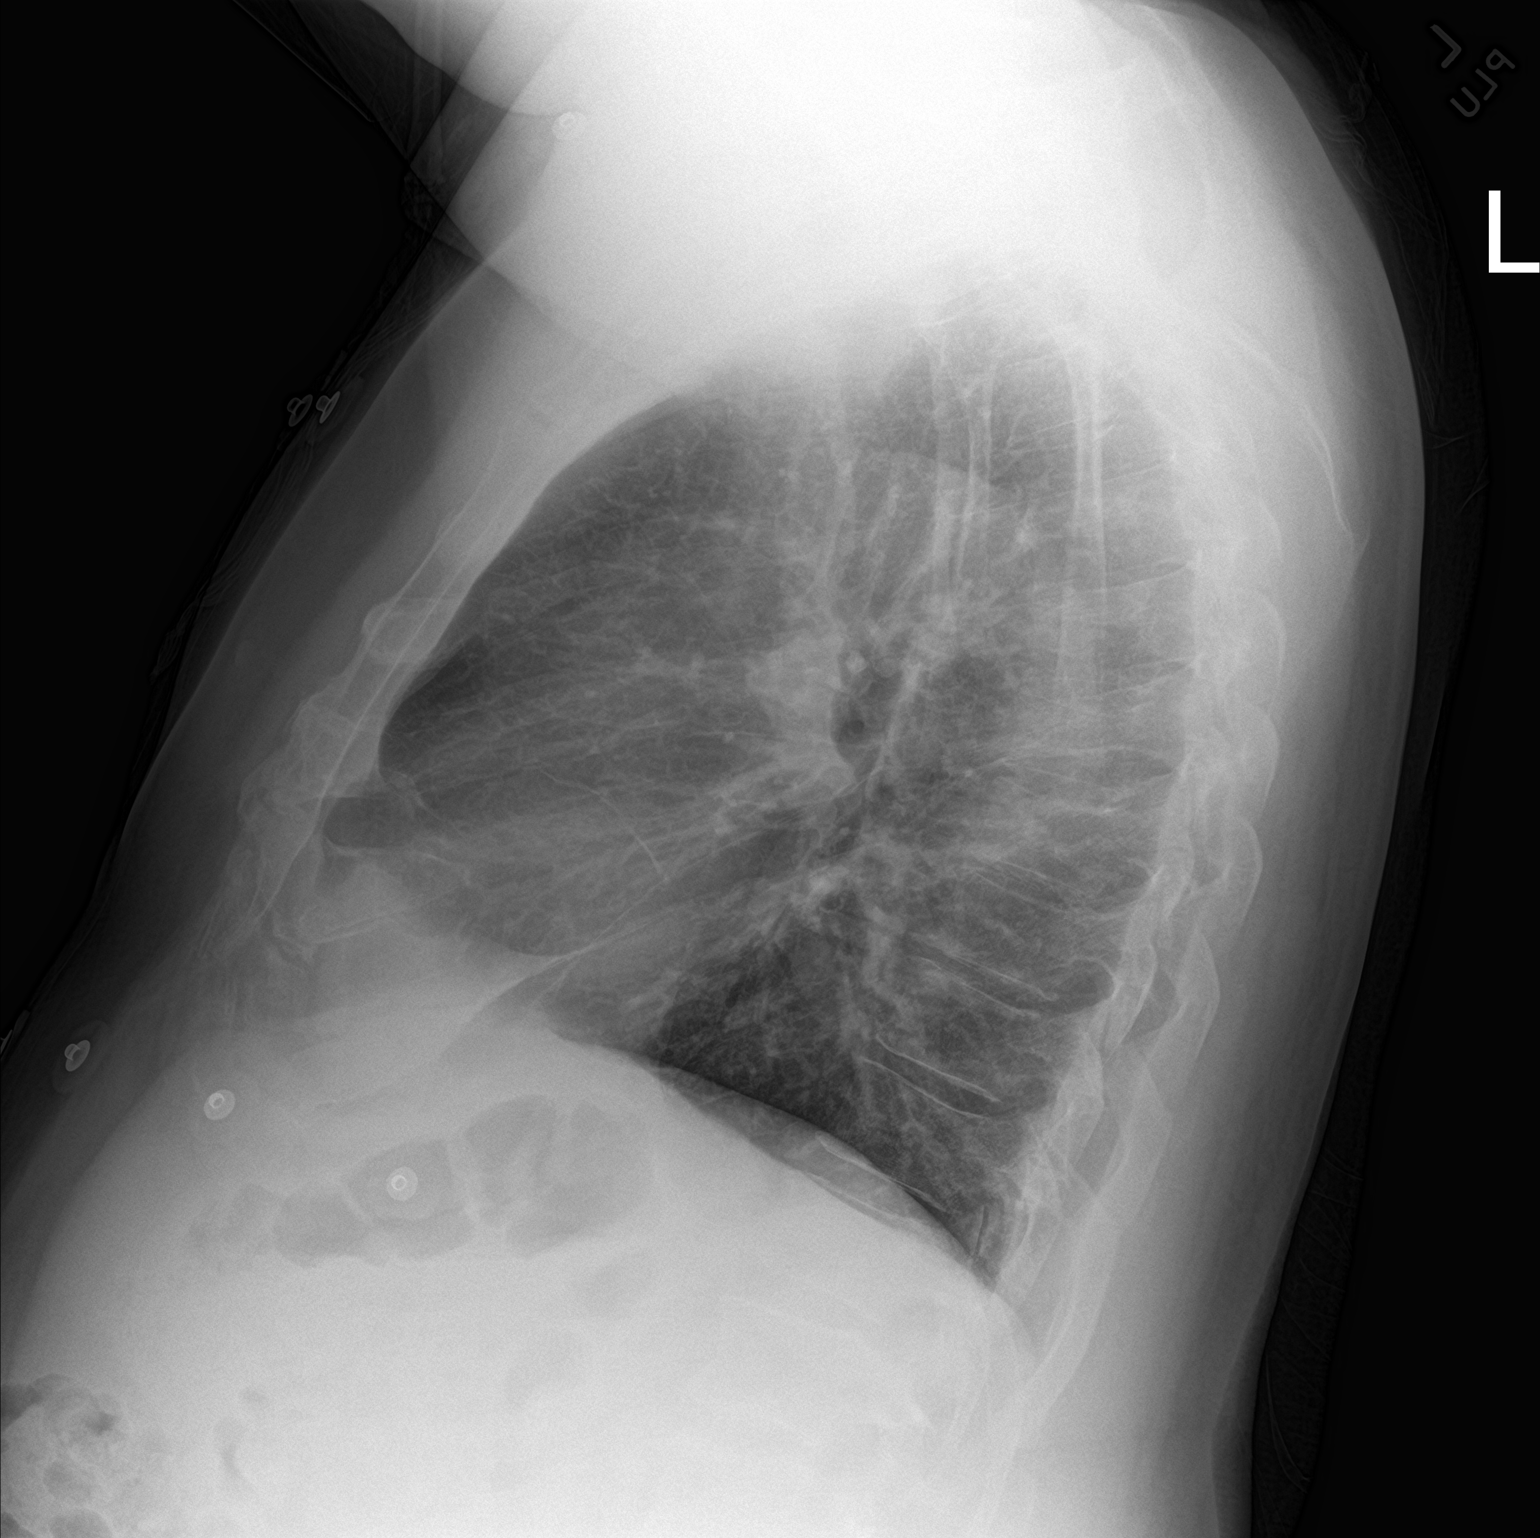

[2 of 2 positions shown; findings below may reference images not displayed]

FINDINGS: No focal consolidation, pleural effusion, or pneumothorax. The
cardiac silhouette is within normal limits. No acute osseous
pathology.
IMPRESSION: No active cardiopulmonary disease.

## 2021-09-02 IMAGING — DX DG CHEST 1V PORT
1 series · 1 of 1 positions shown · non-contrast
Comparison: September 12, 2020

CLINICAL DATA: Shortness of breath.

EXAM:
PORTABLE CHEST 1 VIEW

[chest]
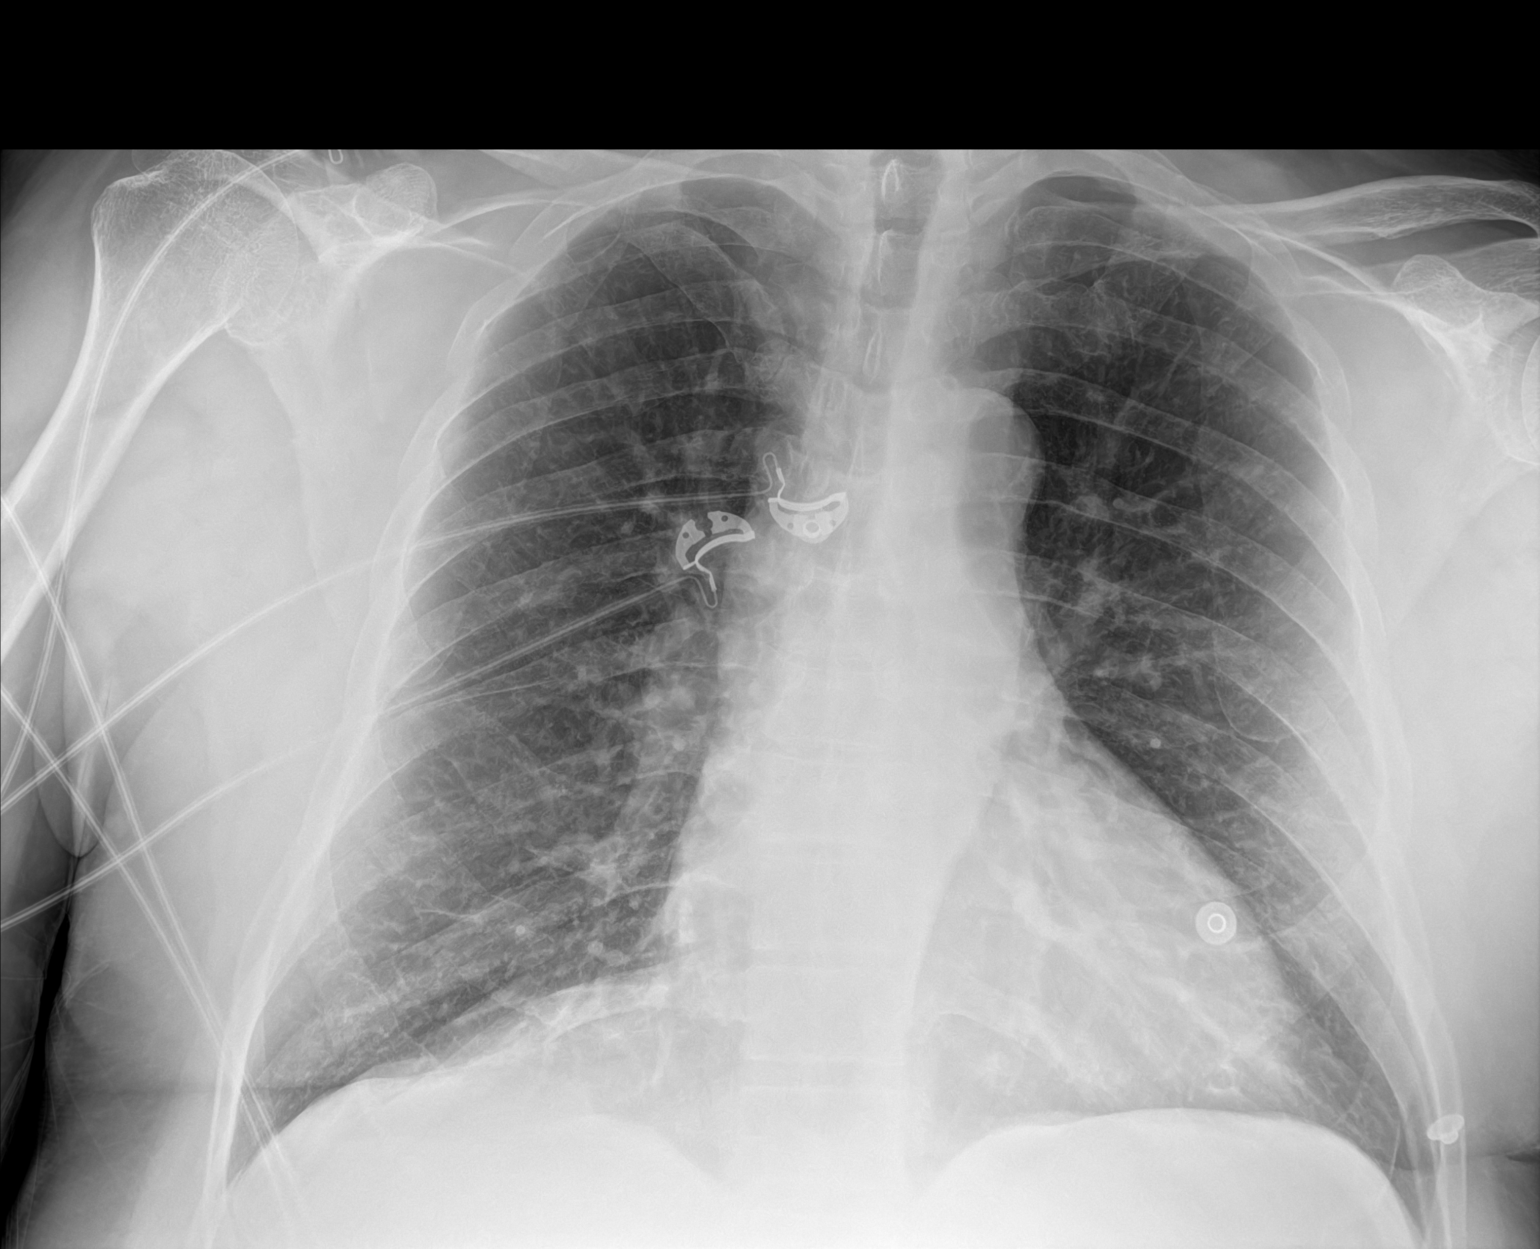

[1 of 1 positions shown; findings below may reference images not displayed]

FINDINGS: Mild, chronic appearing increased lung markings are seen. There is
no evidence of acute infiltrate, pleural effusion or pneumothorax.
The heart size and mediastinal contours are within normal limits.
The visualized skeletal structures are unremarkable.
IMPRESSION: No active cardiopulmonary disease.
